# Patient Record
Sex: Female | Born: 1995 | Race: White | Hispanic: No | Marital: Single | Smoking: Never smoker
Health system: Southern US, Community
[De-identification: ages and names within clinical notes are randomized; demographics above are authoritative.]

## PROBLEM LIST (undated history)

## (undated) DIAGNOSIS — F32A Depression, unspecified: Secondary | ICD-10-CM

## (undated) DIAGNOSIS — F419 Anxiety disorder, unspecified: Secondary | ICD-10-CM

## (undated) DIAGNOSIS — F909 Attention-deficit hyperactivity disorder, unspecified type: Secondary | ICD-10-CM

## (undated) HISTORY — DX: Depression, unspecified: F32.A

## (undated) HISTORY — DX: Attention-deficit hyperactivity disorder, unspecified type: F90.9

## (undated) HISTORY — DX: Anxiety disorder, unspecified: F41.9

---
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
## 2014-09-07 ENCOUNTER — Ambulatory Visit: Payer: Self-pay | Admitting: Otolaryngology

## 2015-01-15 LAB — SURGICAL PATHOLOGY

## 2016-06-17 ENCOUNTER — Ambulatory Visit
Admission: EM | Admit: 2016-06-17 | Discharge: 2016-06-17 | Disposition: A | Discharge status: Home / Self Care | Payer: 59 | Attending: Family Medicine | Admitting: Family Medicine

## 2016-06-17 DIAGNOSIS — L03116 Cellulitis of left lower limb: Secondary | ICD-10-CM | POA: Diagnosis not present

## 2016-06-17 MED ORDER — SULFAMETHOXAZOLE-TRIMETHOPRIM 800-160 MG PO TABS: 1.0000 | ORAL_TABLET | Freq: Two times a day (BID) | ORAL | 0 refills | Status: DC

## 2016-06-17 NOTE — ED Triage Notes (Signed)
Patient c/o of cyst on inner left thigh that may need to be lanced. It is painful to the touch.

## 2016-06-17 NOTE — ED Provider Notes (Signed)
[facility]    CSN: [id number] Arrival date & time: 06/17/16  1809     History   Chief Complaint Chief Complaint  Patient presents with  . Cyst    HPI Deanna Graham is a 20 y.o. female.   20 yo female with a 2 days h/o left inner thigh skin bump that is red and painful. Denies any fevers, chills, drainage.    The history is provided by the patient.    History reviewed. No pertinent past medical history.  There are no active problems to display for this patient.   History reviewed. No pertinent surgical history.  OB History    No data available       Home Medications    Prior to Admission medications   Medication Sig Start Date End Date Taking? Authorizing Provider  escitalopram (LEXAPRO) 10 MG tablet Take 10 mg by mouth daily.   Yes Historical Provider, MD  sulfamethoxazole-trimethoprim (BACTRIM DS,SEPTRA DS) 800-160 MG tablet Take 1 tablet by mouth 2 (two) times daily. 06/17/16   Payton Mccallum, MD    Family History History reviewed. No pertinent family history.  Social History Social History  Substance Use Topics  . Smoking status: Never Smoker  . Smokeless tobacco: Never Used  . Alcohol use No     Allergies   Amoxicillin   Review of Systems Review of Systems   Physical Exam Triage Vital Signs ED Triage Vitals  Enc Vitals Group     BP 06/17/16 1931 117/79     Pulse Rate 06/17/16 1931 72     Resp 06/17/16 1931 18     Temp 06/17/16 1931 98.2 F (36.8 C)     Temp Source 06/17/16 1931 Oral     SpO2 06/17/16 1931 99 %     Weight 06/17/16 1928 240 lb (108.9 kg)     Height 06/17/16 1928 5\' 5"  (1.651 m)     Head Circumference --      Peak Flow --      Pain Score 06/17/16 1931 3     Pain Loc --      Pain Edu? --      Excl. in GC? --    No data found.   Updated Vital Signs BP 117/79 (BP Location: Left Arm)   Pulse 72   Temp 98.2 F (36.8 C) (Oral)   Resp 18   Ht 5\' 5"  (1.651 m)   Wt 240 lb (108.9 kg)   LMP 03/22/2016    SpO2 99%   BMI 39.94 kg/m   Visual Acuity Right Eye Distance:   Left Eye Distance:   Bilateral Distance:    Right Eye Near:   Left Eye Near:    Bilateral Near:     Physical Exam  Constitutional: She appears well-developed and well-nourished. No distress.  Skin: She is not diaphoretic. There is erythema.  Left inner thigh, approximately 1cm slight raised soft, purple-violacious, slightly tender skin lesion with mild surrounding blanchable erythema  Nursing note and vitals reviewed.    UC Treatments / Results  Labs (all labs ordered are listed, but only abnormal results are displayed) Labs Reviewed - No data to display  EKG  EKG Interpretation None       Radiology No results found.  Procedures Procedures (including critical care time)  Medications Ordered in UC Medications - No data to display   Initial Impression / Assessment and Plan / UC Course  I have reviewed the triage vital signs and the  nursing notes.  Pertinent labs & imaging results that were available during my care of the patient were reviewed by me and considered in my medical decision making (see chart for details).  Clinical Course      Final Clinical Impressions(s) / UC Diagnoses   Final diagnoses:  Cellulitis of left thigh    New Prescriptions Discharge Medication List as of 06/17/2016  8:07 PM    START taking these medications   Details  sulfamethoxazole-trimethoprim (BACTRIM DS,SEPTRA DS) 800-160 MG tablet Take 1 tablet by mouth 2 (two) times daily., Starting Tue 06/17/2016, Normal       1. diagnosis reviewed with patient 2. rx as per orders above; reviewed possible side effects, interactions, risks and benefits  3. Recommend supportive treatment with warm compresses to area 4. Follow-up prn if symptoms worsen or don't improve   Payton Mccallumrlando Osmin Welz, MD 06/17/16 2102

## 2016-06-20 ENCOUNTER — Telehealth: Payer: Self-pay

## 2016-06-20 NOTE — Telephone Encounter (Signed)
Courtesy call back completed today for patient's recent visit at [facility]. Patient did not answer, left message on machine to call back with any questions or concerns.   

## 2017-02-19 ENCOUNTER — Ambulatory Visit
Admission: EM | Admit: 2017-02-19 | Discharge: 2017-02-19 | Disposition: A | Discharge status: Home / Self Care | Payer: BLUE CROSS/BLUE SHIELD | Attending: Family Medicine | Admitting: Family Medicine

## 2017-02-19 DIAGNOSIS — J020 Streptococcal pharyngitis: Secondary | ICD-10-CM

## 2017-02-19 LAB — RAPID STREP SCREEN (MED CTR MEBANE ONLY): STREPTOCOCCUS, GROUP A SCREEN (DIRECT): POSITIVE — AB

## 2017-02-19 MED ORDER — AZITHROMYCIN 250 MG PO TABS: ORAL_TABLET | ORAL | 0 refills | Status: DC

## 2017-02-19 NOTE — ED Triage Notes (Signed)
Pt c/o sore throat since last night and a close friend tested positive for Strep this morning.

## 2017-02-19 NOTE — ED Provider Notes (Signed)
CSN: [id number]     Arrival date & time 02/19/17  1901 History   First MD Initiated Contact with Patient 02/19/17 1934     Chief Complaint  Patient presents with  . Sore Throat   (Consider location/radiation/quality/duration/timing/severity/associated sxs/prior Treatment) HPI  This a 21 year old female who started having a sore throat last night. She was notified by a friend who is very close to her that she tested positive for strep this morning. Patient had numerous stress strep infections in the past but not since she had her tonsils removed. She states she's had a low-grade fever in the low 99's.       History reviewed. No pertinent past medical history. History reviewed. No pertinent surgical history. History reviewed. No pertinent family history. Social History  Substance Use Topics  . Smoking status: Never Smoker  . Smokeless tobacco: Never Used  . Alcohol use No   OB History    No data available     Review of Systems  Constitutional: Positive for activity change, chills and fever.  HENT: Positive for congestion and sore throat.   All other systems reviewed and are negative.   Allergies  Amoxicillin  Home Medications   Prior to Admission medications   Medication Sig Start Date End Date Taking? Authorizing Provider  escitalopram (LEXAPRO) 10 MG tablet Take 10 mg by mouth daily.   Yes [provider]  azithromycin (ZITHROMAX Z-PAK) 250 MG tablet Use as per package instructions 02/19/17   Lutricia Feiloemer, Kemp Gomes P, PA-C   Meds Ordered and Administered this Visit  Medications - No data to display  BP 135/79 (BP Location: Left Arm)   Pulse 82   Temp 98.8 F (37.1 C) (Oral)   Resp 18   Ht 5\' 5"  (1.651 m)   Wt 270 lb (122.5 kg)   SpO2 99%   BMI 44.93 kg/m  No data found.   Physical Exam  Constitutional: She is oriented to person, place, and time. She appears well-developed and well-nourished. No distress.  HENT:  Head: Normocephalic.  Right Ear:  External ear normal.  Left Ear: External ear normal.  Nose: Nose normal.  Mouth/Throat: Oropharynx is clear and moist. No oropharyngeal exudate.  Eyes: Pupils are equal, round, and reactive to light. Right eye exhibits no discharge. Left eye exhibits no discharge.  Neck: Normal range of motion.  Pulmonary/Chest: Effort normal and breath sounds normal.  Musculoskeletal: Normal range of motion.  Lymphadenopathy:    She has no cervical adenopathy.  Neurological: She is alert and oriented to person, place, and time.  Skin: Skin is warm and dry. She is not diaphoretic.  Psychiatric: She has a normal mood and affect. Her behavior is normal. Judgment and thought content normal.  Nursing note and vitals reviewed.   Urgent Care Course     Procedures (including critical care time)  Labs Review Labs Reviewed  RAPID STREP SCREEN (NOT AT [facility]) - Abnormal; Notable for the following:       Result Value   Streptococcus, Group A Screen (Direct) POSITIVE (*)    All other components within normal limits    Imaging Review No results found.   Visual Acuity Review  Right Eye Distance:   Left Eye Distance:   Bilateral Distance:    Right Eye Near:   Left Eye Near:    Bilateral Near:         MDM   1. Strep throat    Discharge Medication List as of 02/19/2017  7:47 PM  START taking these medications   Details  azithromycin (ZITHROMAX Z-PAK) 250 MG tablet Use as per package instructions, Normal      Plan: 1. Test/x-ray results and diagnosis reviewed with patient 2. rx as per orders; risks, benefits, potential side effects reviewed with patient 3. Recommend supportive treatment with Salt water gargles for comfort. Use Motrin for inflammation and fever control. Follow up with primary care physician if she is not improving. 4. F/u prn if symptoms worsen or don't improve     Lutricia Feil, PA-C 02/19/17 2001

## 2019-05-11 ENCOUNTER — Encounter: Payer: Self-pay | Admitting: Emergency Medicine

## 2019-05-11 ENCOUNTER — Ambulatory Visit
Admission: EM | Admit: 2019-05-11 | Discharge: 2019-05-11 | Disposition: A | Discharge status: Home / Self Care | Payer: BLUE CROSS/BLUE SHIELD | Attending: Family Medicine | Admitting: Family Medicine

## 2019-05-11 ENCOUNTER — Other Ambulatory Visit: Payer: Self-pay

## 2019-05-11 ENCOUNTER — Encounter: Payer: Self-pay | Admitting: Obstetrics and Gynecology

## 2019-05-11 DIAGNOSIS — N898 Other specified noninflammatory disorders of vagina: Secondary | ICD-10-CM

## 2019-05-11 LAB — WET PREP, GENITAL
Clue Cells Wet Prep HPF POC: NONE SEEN
Sperm: NONE SEEN
Trich, Wet Prep: NONE SEEN
Yeast Wet Prep HPF POC: NONE SEEN

## 2019-05-11 LAB — URINALYSIS, COMPLETE (UACMP) WITH MICROSCOPIC
Glucose, UA: NEGATIVE mg/dL
Leukocytes,Ua: NEGATIVE
Nitrite: NEGATIVE
Specific Gravity, Urine: >1.03 — ABNORMAL HIGH (ref 1.005–1.030)
pH: 6 (ref 5.0–8.0)

## 2019-05-11 LAB — PREGNANCY, URINE: Preg Test, Ur: NEGATIVE

## 2019-05-11 NOTE — Discharge Instructions (Signed)
We will call with positive results. ° °Take care ° °Dr. Jia Mohamed  °

## 2019-05-11 NOTE — ED Triage Notes (Addendum)
Patient here for STD testing for Chlamydia. Patient c/o vaginal discharge that started 1 week ago. She reports she changed birth control pills and is unsure if this is related.  She is also requesting a pregnancy test

## 2019-05-11 NOTE — ED Provider Notes (Signed)
[facility]    CSN: [id number] Arrival date & time: 05/11/19  0849  History   Chief Complaint Chief Complaint  Patient presents with  . Exposure to STD   HPI  23 year old female presents with vaginal discharge.  Patient reports that she has had dark, bloody discharge for the past 2 weeks.  She has recently changed her oral contraceptive.  She believes that this may be the culprit.  Patient is sexually active.  She desires STD testing today.  She states that her partner has complained of "irritation" which has prompted her to want STD testing.  She states that she is monogamous.  Denies urinary symptoms.  Patient requesting a pregnancy test as well.  No abdominal pain.  No medications or interventions tried.  No other complaints.  Hx reviewed as below. PMH: Morbid Obesity, Depression/Anxiety, Headache, Seizure  Surgical Hx:  MYRINGOTOMY & TUBES 09/23/1995 - 09/21/1996     CLOSED REDUCTION RADIAL HEAD / NECK FRACTURE 09/22/1998 - 09/22/1999  Under general anesthesia   TONSILLECTOMY 09/22/2013 - 09/21/2014  & adenioidectomy      Home Medications    Prior to Admission medications   Medication Sig Start Date End Date Taking? Authorizing Provider  escitalopram (LEXAPRO) 10 MG tablet Take 10 mg by mouth daily.   Yes [provider]  norgestimate-ethinyl estradiol (MONO-LINYAH) 0.25-35 MG-MCG tablet Take 1 tablet by mouth daily.   Yes [provider]   Social History Social History   Tobacco Use  . Smoking status: Never Smoker  . Smokeless tobacco: Never Used  Substance Use Topics  . Alcohol use: Yes  . Drug use: No     Allergies   Amoxicillin   Review of Systems Review of Systems  Constitutional: Negative.   Gastrointestinal: Negative.   Genitourinary: Positive for vaginal discharge.   Physical Exam Triage Vital Signs ED Triage Vitals  Enc Vitals Group     BP 05/11/19 0908 124/68     Pulse Rate 05/11/19 0908 90     Resp 05/11/19  0908 18     Temp 05/11/19 0908 98.6 F (37 C)     Temp src --      SpO2 --      Weight 05/11/19 0905 262 lb (118.8 kg)     Height 05/11/19 0905 5\' 5"  (1.651 m)     Head Circumference --      Peak Flow --      Pain Score 05/11/19 0905 0     Pain Loc --      Pain Edu? --      Excl. in GC? --    Updated Vital Signs BP 124/68 (BP Location: Right Arm)   Pulse 90   Temp 98.6 F (37 C)   Resp 18   Ht 5\' 5"  (1.651 m)   Wt 118.8 kg   BMI 43.60 kg/m   Visual Acuity Right Eye Distance:   Left Eye Distance:   Bilateral Distance:    Right Eye Near:   Left Eye Near:    Bilateral Near:     Physical Exam Vitals signs and nursing note reviewed.  Constitutional:      General: She is not in acute distress.    Appearance: Normal appearance. She is obese.  HENT:     Head: Normocephalic and atraumatic.  Eyes:     General:        Right eye: No discharge.        Left eye: No discharge.  Conjunctiva/sclera: Conjunctivae normal.  Cardiovascular:     Rate and Rhythm: Normal rate and regular rhythm.     Heart sounds: No murmur.  Pulmonary:     Effort: Pulmonary effort is normal.     Breath sounds: Normal breath sounds. No wheezing, rhonchi or rales.  Abdominal:     General: There is no distension.     Palpations: Abdomen is soft.     Tenderness: There is no abdominal tenderness.  Neurological:     Mental Status: She is alert.  Psychiatric:        Mood and Affect: Mood normal.        Behavior: Behavior normal.    UC Treatments / Results  Labs (all labs ordered are listed, but only abnormal results are displayed) Labs Reviewed  WET PREP, GENITAL - Abnormal; Notable for the following components:      Result Value   WBC, Wet Prep HPF POC FEW (*)    All other components within normal limits  URINALYSIS, COMPLETE (UACMP) WITH MICROSCOPIC - Abnormal; Notable for the following components:   Specific Gravity, Urine >1.030 (*)    Hgb urine dipstick SMALL (*)    Bilirubin Urine  SMALL (*)    Ketones, ur TRACE (*)    Protein, ur TRACE (*)    Bacteria, UA FEW (*)    All other components within normal limits  GC/CHLAMYDIA PROBE AMP  PREGNANCY, URINE    EKG   Radiology No results found.  Procedures Procedures (including critical care time)  Medications Ordered in UC Medications - No data to display  Initial Impression / Assessment and Plan / UC Course  I have reviewed the triage vital signs and the nursing notes.  Pertinent labs & imaging results that were available during my care of the patient were reviewed by me and considered in my medical decision making (see chart for details).    23 year old female presents with vaginal discharge.  Urinalysis unremarkable.  Wet prep negative.  Pregnancy test negative.  Awaiting STD testing.  I feel that this is secondary to recent change in oral contraceptive.  Awaiting test results.  Supportive care.  Final Clinical Impressions(s) / UC Diagnoses   Final diagnoses:  Vaginal discharge     Discharge Instructions     We will call with positive results.  Take care  Dr. Lacinda Axon   ED Prescriptions    None     Controlled Substance Prescriptions [place] Controlled Substance Registry consulted? Not Applicable   Coral Spikes, DO 05/11/19 1016

## 2019-05-14 LAB — GC/CHLAMYDIA PROBE AMP
Chlamydia trachomatis, NAA: POSITIVE — AB
Neisseria Gonorrhoeae by PCR: NEGATIVE

## 2019-05-16 ENCOUNTER — Telehealth (HOSPITAL_COMMUNITY): Payer: Self-pay | Admitting: Emergency Medicine

## 2019-05-16 MED ORDER — AZITHROMYCIN 250 MG PO TABS: 1000.0000 mg | ORAL_TABLET | Freq: Once | ORAL | 0 refills | Status: AC

## 2019-05-16 NOTE — Telephone Encounter (Signed)
Chlamydia is positive.  Rx po zithromax 1g #1 dose no refills was sent to the pharmacy of record.  Pt needs education to please refrain from sexual intercourse for 7 days to give the medicine time to work, sexual partners need to be notified and tested/treated.  Condoms may reduce risk of reinfection.  Recheck or followup with PCP for further evaluation if symptoms are not improving.   [facility] notified.  Patient contacted and made aware of    results, all questions answered   

## 2021-04-17 NOTE — Progress Notes (Deleted)
    Gauger, Hermenia Fiscal, NP   No chief complaint on file.   HPI:      Ms. Deanna Graham is a 25 y.o. No obstetric history on file. whose LMP was No LMP recorded. (Menstrual status: Irregular Periods)., presents today for NP  ??PAP  No past medical history on file.  No past surgical history on file.  No family history on file.  Social History   Socioeconomic History   Marital status: Single    Spouse name: Not on file   Number of children: Not on file   Years of education: Not on file   Highest education level: Not on file  Occupational History   Not on file  Tobacco Use   Smoking status: Never   Smokeless tobacco: Never  Vaping Use   Vaping Use: Every day  Substance and Sexual Activity   Alcohol use: Yes   Drug use: No   Sexual activity: Not on file  Other Topics Concern   Not on file  Social History Narrative   Not on file   Social Determinants of Health   Financial Resource Strain: Not on file  Food Insecurity: Not on file  Transportation Needs: Not on file  Physical Activity: Not on file  Stress: Not on file  Social Connections: Not on file  Intimate Partner Violence: Not on file    Outpatient Medications Prior to Visit  Medication Sig Dispense Refill   escitalopram (LEXAPRO) 10 MG tablet Take 10 mg by mouth daily.     norgestimate-ethinyl estradiol (MONO-LINYAH) 0.25-35 MG-MCG tablet Take 1 tablet by mouth daily.     No facility-administered medications prior to visit.      ROS:  Review of Systems BREAST: No symptoms   OBJECTIVE:   Vitals:  There were no vitals taken for this visit.  Physical Exam  Results: No results found for this or any previous visit (from the past 24 hour(s)).   Assessment/Plan: No diagnosis found.    No orders of the defined types were placed in this encounter.     No follow-ups on file.  Milbern Doescher B. Rebekka Lobello, PA-C 04/17/2021 2:55 PM

## 2021-04-18 ENCOUNTER — Encounter: Payer: BC Managed Care – PPO | Admitting: Obstetrics and Gynecology

## 2021-04-18 DIAGNOSIS — Z124 Encounter for screening for malignant neoplasm of cervix: Secondary | ICD-10-CM

## 2021-04-18 DIAGNOSIS — Z113 Encounter for screening for infections with a predominantly sexual mode of transmission: Secondary | ICD-10-CM

## 2022-01-09 ENCOUNTER — Ambulatory Visit: Payer: Managed Care, Other (non HMO) | Admitting: Nurse Practitioner

## 2022-01-09 ENCOUNTER — Other Ambulatory Visit: Payer: Self-pay

## 2022-01-09 ENCOUNTER — Encounter: Payer: Self-pay | Admitting: Nurse Practitioner

## 2022-01-09 VITALS — BP 124/72 | HR 98 | Temp 98.6°F | Resp 18 | Ht 66.0 in | Wt 312.5 lb

## 2022-01-09 DIAGNOSIS — K591 Functional diarrhea: Secondary | ICD-10-CM

## 2022-01-09 DIAGNOSIS — Z131 Encounter for screening for diabetes mellitus: Secondary | ICD-10-CM

## 2022-01-09 DIAGNOSIS — R6889 Other general symptoms and signs: Secondary | ICD-10-CM

## 2022-01-09 DIAGNOSIS — M7989 Other specified soft tissue disorders: Secondary | ICD-10-CM

## 2022-01-09 DIAGNOSIS — R635 Abnormal weight gain: Secondary | ICD-10-CM

## 2022-01-09 DIAGNOSIS — R509 Fever, unspecified: Secondary | ICD-10-CM

## 2022-01-09 DIAGNOSIS — Z13220 Encounter for screening for lipoid disorders: Secondary | ICD-10-CM

## 2022-01-09 DIAGNOSIS — N915 Oligomenorrhea, unspecified: Secondary | ICD-10-CM | POA: Diagnosis not present

## 2022-01-09 DIAGNOSIS — F418 Other specified anxiety disorders: Secondary | ICD-10-CM | POA: Insufficient documentation

## 2022-01-09 DIAGNOSIS — Z1159 Encounter for screening for other viral diseases: Secondary | ICD-10-CM

## 2022-01-09 DIAGNOSIS — R238 Other skin changes: Secondary | ICD-10-CM

## 2022-01-09 DIAGNOSIS — Z114 Encounter for screening for human immunodeficiency virus [HIV]: Secondary | ICD-10-CM

## 2022-01-09 DIAGNOSIS — R61 Generalized hyperhidrosis: Secondary | ICD-10-CM

## 2022-01-09 NOTE — Progress Notes (Signed)
? ?BP 124/72   Pulse 98   Temp 98.6 ?F (37 ?C) (Oral)   Resp 18   Ht 5\' 6"  (1.676 m)   Wt (!) 312 lb 8 oz (141.7 kg)   SpO2 97%   BMI 50.44 kg/m?   ? ?Subjective:  ? ? Patient ID: Deanna Graham, female    DOB: [birth year], 26 y.o.   MRN: [id number] ? ?HPI: ?Deanna Graham is a 26 y.o. female ? ?Chief Complaint  ?Patient presents with  ? Establish Care  ? Joint Pain  ?  inflammation  ? ?Establish care:  last physical was about two years ago, she reports she had elevated blood pressure but was not diagnosed with HTN and has not had any issues.  ? ?Fever/sweating/hand and feet swelling/ heat intolerance/ weight gain/skin sensitivity: She says that these symptoms have been going on for many years. She says maybe 5-6 years. She says that she does get a rash across her face. She does not currently have one. She reports low grade fevers, feels flushed off and on.  She says she feels like she has had a lot of inflammation.  She says that she has intolerance to heat and is sweating a lot more than normal. She says her skin feels sensitive to touch.  She says she feels like her feet and hands have been swelling. She says she has had a lot of cramping, stiffness and tightness in hands and feet. She says that after she eats she gets gassy, diarrhea and abdominal cramping. She says that she has gained about 100 lbs over the last 8 years.  ? ?Irregular periods: [facility]: was in February.  She says her first period was 67-47 years old but has never been regular.  She says that she has been on birth control before and it did not regulate her periods. Will place referral to GYN.  ? ?Obesity: She is currently 312 pounds with a BMI of 50.44. she says that over the last 8 years she has gained 100 pounds.  She says that she eats pretty healthy and is physically active, continues to gain weight.  We will get labs. ? ?Eczema: She says that she typically gets eczema in her flexeril areas like the bend of her arms and legs. She is using  eucerine cream to help  ? ?Relevant past medical, surgical, family and social history reviewed and updated as indicated. Interim medical history since our last visit reviewed. ?Allergies and medications reviewed and updated. ? ?Review of Systems ? ?Constitutional : positive for fever and weight change.  ?Respiratory: Negative for cough and shortness of breath.   ?Cardiovascular: Negative for chest pain or palpitations.  ?Gastrointestinal: positive for abdominal pain, diarrhea ?Musculoskeletal: Negative for gait problem, positive for hand and feet joint swelling.  ?Skin: Negative for rash.  ?Neurological: Negative for dizziness or headache.  ?No other specific complaints in a complete review of systems (except as listed in HPI above).  ? ?   ?Objective:  ?  ?BP 124/72   Pulse 98   Temp 98.6 ?F (37 ?C) (Oral)   Resp 18   Ht 5\' 6"  (1.676 m)   Wt (!) 312 lb 8 oz (141.7 kg)   SpO2 97%   BMI 50.44 kg/m?   ?Wt Readings from Last 3 Encounters:  ?01/09/22 (!) 312 lb 8 oz (141.7 kg)  ?05/11/19 262 lb (118.8 kg)  ?02/19/17 270 lb (122.5 kg)  ?  ?Physical Exam ? ?Constitutional: Patient appears well-developed and well-nourished.  Obese  No distress.  ?HEENT: head atraumatic, normocephalic, pupils equal and reactive to light, neck supple ?Cardiovascular: Normal rate, regular rhythm and normal heart sounds.  No murmur heard. No BLE edema. ?Pulmonary/Chest: Effort normal and breath sounds normal. No respiratory distress. ?Abdominal: Soft.  There is no tenderness. ?Psychiatric: Patient has a normal mood and affect. behavior is normal. Judgment and thought content normal.  ?Results for orders placed or performed during the hospital encounter of 05/11/19  ?Wet prep, genital  ? Specimen: Vaginal  ?Result Value Ref Range  ? Yeast Wet Prep HPF POC NONE SEEN NONE SEEN  ? Trich, Wet Prep NONE SEEN NONE SEEN  ? Clue Cells Wet Prep HPF POC NONE SEEN NONE SEEN  ? WBC, Wet Prep HPF POC FEW (A) NONE SEEN  ? Sperm NONE SEEN    ?GC/Chlamydia Probe Amp (send out to [facility])  ? Specimen: Vaginal; Sterile Swab  ?Result Value Ref Range  ? Chlamydia trachomatis, NAA Positive (A) Negative  ? Neisseria Gonorrhoeae by PCR Negative Negative  ? CT/NG NAA Source ENDOCERVICAL   ?Urinalysis, Complete w Microscopic  ?Result Value Ref Range  ? Color, Urine YELLOW YELLOW  ? APPearance CLEAR CLEAR  ? Specific Gravity, Urine >1.030 (H) 1.005 - 1.030  ? pH 6.0 5.0 - 8.0  ? Glucose, UA NEGATIVE NEGATIVE mg/dL  ? Hgb urine dipstick SMALL (A) NEGATIVE  ? Bilirubin Urine SMALL (A) NEGATIVE  ? Ketones, ur TRACE (A) NEGATIVE mg/dL  ? Protein, ur TRACE (A) NEGATIVE mg/dL  ? Nitrite NEGATIVE NEGATIVE  ? Leukocytes,Ua NEGATIVE NEGATIVE  ? Squamous Epithelial / LPF 0-5 0 - 5  ? WBC, UA 6-10 0 - 5 WBC/hpf  ? RBC / HPF 0-5 0 - 5 RBC/hpf  ? Bacteria, UA FEW (A) NONE SEEN  ? Mucus PRESENT   ?Pregnancy, urine  ?Result Value Ref Range  ? Preg Test, Ur NEGATIVE NEGATIVE  ? ?   ?Assessment & Plan:  ? ?1. Oligomenorrhea, unspecified type ? ?- Ambulatory referral to Gynecology ? ?2. Obesity, morbid, BMI 40.0-49.9 ([place]) ? ?- CBC with Differential/Platelet ?- COMPLETE METABOLIC PANEL WITH GFR ?- Hemoglobin A1c ? ?3. Screening for diabetes mellitus ? ?- COMPLETE METABOLIC PANEL WITH GFR ?- Hemoglobin A1c ? ?4. Heat intolerance ? ?- CBC with Differential/Platelet ?- COMPLETE METABOLIC PANEL WITH GFR ?- Sedimentation rate ?- C-reactive protein ?- ANA ?- TSH ? ?5. Weight gain ? ?- CBC with Differential/Platelet ?- COMPLETE METABOLIC PANEL WITH GFR ?- Sedimentation rate ?- C-reactive protein ?- ANA ?- TSH ? ?6. Functional diarrhea ? ?- CBC with Differential/Platelet ?- COMPLETE METABOLIC PANEL WITH GFR ?- Sedimentation rate ?- C-reactive protein ?- ANA ?- TSH ?- Calprotectin, Fecal ? ?7. Skin sensitivity ? ?- CBC with Differential/Platelet ?- COMPLETE METABOLIC PANEL WITH GFR ?- Sedimentation rate ?- C-reactive protein ?- ANA ?- TSH ? ?8. Excessive sweating ?- CBC with  Differential/Platelet ?- COMPLETE METABOLIC PANEL WITH GFR ?- Sedimentation rate ?- C-reactive protein ?- ANA ?- TSH ? ?9. Swelling of both hands ? ?- CBC with Differential/Platelet ?- COMPLETE METABOLIC PANEL WITH GFR ?- Sedimentation rate ?- C-reactive protein ?- ANA ?- TSH ? ?10. Bilateral swelling of feet ? ?- CBC with Differential/Platelet ?- COMPLETE METABOLIC PANEL WITH GFR ?- Sedimentation rate ?- C-reactive protein ?- ANA ?- TSH ? ?11. Low grade fever ? ?- CBC with Differential/Platelet ?- COMPLETE METABOLIC PANEL WITH GFR ?- Sedimentation rate ?- C-reactive protein ?- ANA ?- TSH ? ?12. Encounter for hepatitis C screening test  for low risk patient ? ?- Hepatitis C antibody ? ?13. Encounter for screening for HIV ? ?- HIV Antibody (routine testing w rflx) ? ?14. Screening for cholesterol level ? ?- Lipid panel  ? ?Follow up plan: ?Return in about 6 months (around 07/11/2022) for cpe. ? ? ? ? ? ?

## 2022-01-11 LAB — COMPLETE METABOLIC PANEL WITH GFR
AG Ratio: 1.4 (calc) (ref 1.0–2.5)
ALT: 27 U/L (ref 6–29)
AST: 14 U/L (ref 10–30)
Albumin: 3.9 g/dL (ref 3.6–5.1)
Alkaline phosphatase (APISO): 107 U/L (ref 31–125)
BUN: 11 mg/dL (ref 7–25)
CO2: 29 mmol/L (ref 20–32)
Calcium: 9.3 mg/dL (ref 8.6–10.2)
Chloride: 104 mmol/L (ref 98–110)
Creat: 0.8 mg/dL (ref 0.50–0.96)
Globulin: 2.7 g/dL (calc) (ref 1.9–3.7)
Glucose, Bld: 110 mg/dL — ABNORMAL HIGH (ref 65–99)
Potassium: 4 mmol/L (ref 3.5–5.3)
Sodium: 142 mmol/L (ref 135–146)
Total Bilirubin: 0.3 mg/dL (ref 0.2–1.2)
Total Protein: 6.6 g/dL (ref 6.1–8.1)
eGFR: 104 mL/min/{1.73_m2} (ref >=60)

## 2022-01-11 LAB — CBC WITH DIFFERENTIAL/PLATELET
Absolute Monocytes: 671 cells/uL (ref 200–950)
Basophils Absolute: 31 cells/uL (ref 0–200)
Basophils Relative: 0.4 %
Eosinophils Absolute: 296 cells/uL (ref 15–500)
Eosinophils Relative: 3.8 %
HCT: 39.6 % (ref 35.0–45.0)
Hemoglobin: 13 g/dL (ref 11.7–15.5)
Lymphs Abs: 2075 cells/uL (ref 850–3900)
MCH: 27.2 pg (ref 27.0–33.0)
MCHC: 32.8 g/dL (ref 32.0–36.0)
MCV: 82.8 fL (ref 80.0–100.0)
MPV: 10.8 fL (ref 7.5–12.5)
Monocytes Relative: 8.6 %
Neutro Abs: 4727 cells/uL (ref 1500–7800)
Neutrophils Relative %: 60.6 %
Platelets: 405 10*3/uL — ABNORMAL HIGH (ref 140–400)
RBC: 4.78 10*6/uL (ref 3.80–5.10)
RDW: 12.8 % (ref 11.0–15.0)
Total Lymphocyte: 26.6 %
WBC: 7.8 10*3/uL (ref 3.8–10.8)

## 2022-01-11 LAB — LIPID PANEL
Cholesterol: 179 mg/dL (ref <200)
HDL: 37 mg/dL — ABNORMAL LOW (ref >=50)
LDL Cholesterol (Calc): 119 mg/dL (calc) — ABNORMAL HIGH
Non-HDL Cholesterol (Calc): 142 mg/dL (calc) — ABNORMAL HIGH (ref <130)
Total CHOL/HDL Ratio: 4.8 (calc) (ref <5.0)
Triglycerides: 120 mg/dL (ref <150)

## 2022-01-11 LAB — SEDIMENTATION RATE: Sed Rate: 29 mm/h — ABNORMAL HIGH (ref 0–20)

## 2022-01-11 LAB — ANA: Anti Nuclear Antibody (ANA): POSITIVE — AB

## 2022-01-11 LAB — HEPATITIS C ANTIBODY
Hepatitis C Ab: NONREACTIVE
SIGNAL TO CUT-OFF: 0.18 (ref <1.00)

## 2022-01-11 LAB — HEMOGLOBIN A1C
Hgb A1c MFr Bld: 5.6 % of total Hgb (ref <5.7)
Mean Plasma Glucose: 114 mg/dL
eAG (mmol/L): 6.3 mmol/L

## 2022-01-11 LAB — ANTI-NUCLEAR AB-TITER (ANA TITER): ANA Titer 1: 1:80 {titer} — ABNORMAL HIGH

## 2022-01-11 LAB — C-REACTIVE PROTEIN: CRP: 28.8 mg/L — ABNORMAL HIGH (ref <8.0)

## 2022-01-11 LAB — HIV ANTIBODY (ROUTINE TESTING W REFLEX): HIV 1&2 Ab, 4th Generation: NONREACTIVE

## 2022-01-11 LAB — TSH: TSH: 1.25 mIU/L

## 2022-01-13 ENCOUNTER — Other Ambulatory Visit: Payer: Self-pay | Admitting: Nurse Practitioner

## 2022-01-13 ENCOUNTER — Telehealth: Payer: Self-pay

## 2022-01-13 DIAGNOSIS — R7982 Elevated C-reactive protein (CRP): Secondary | ICD-10-CM

## 2022-01-13 DIAGNOSIS — R768 Other specified abnormal immunological findings in serum: Secondary | ICD-10-CM

## 2022-01-13 DIAGNOSIS — R700 Elevated erythrocyte sedimentation rate: Secondary | ICD-10-CM

## 2022-01-13 NOTE — Telephone Encounter (Signed)
-----   Message from Darrel Hoover, RN sent at 01/09/2022  4:23 PM EDT ----- Regarding: referral Schedule with any APP for irregular bleeding on birth control with no improvement in 4-5 weeks unless you have something sooner.

## 2022-01-13 NOTE — Telephone Encounter (Signed)
[facility] referring for irregular bleeding on birth control with no improvement. Sch w CNM.in 4-5 weeks unless you have something sooner. Called and left voicemail for patient to call back to be scheduled.

## 2022-01-14 NOTE — Telephone Encounter (Signed)
Patient is scheduled for 01/31/22 with LMD

## 2022-01-31 ENCOUNTER — Encounter: Payer: BC Managed Care – PPO | Admitting: Licensed Practical Nurse

## 2022-02-07 ENCOUNTER — Encounter: Payer: BC Managed Care – PPO | Admitting: Licensed Practical Nurse

## 2022-02-14 ENCOUNTER — Encounter: Payer: BC Managed Care – PPO | Admitting: Obstetrics

## 2022-02-19 ENCOUNTER — Encounter: Payer: Self-pay | Admitting: Obstetrics

## 2022-02-19 ENCOUNTER — Ambulatory Visit: Payer: Managed Care, Other (non HMO) | Admitting: Obstetrics

## 2022-02-19 VITALS — BP 130/92 | Wt 320.6 lb

## 2022-02-19 DIAGNOSIS — Z6841 Body Mass Index (BMI) 40.0 and over, adult: Secondary | ICD-10-CM

## 2022-02-19 DIAGNOSIS — R030 Elevated blood-pressure reading, without diagnosis of hypertension: Secondary | ICD-10-CM | POA: Diagnosis not present

## 2022-02-19 DIAGNOSIS — N939 Abnormal uterine and vaginal bleeding, unspecified: Secondary | ICD-10-CM

## 2022-02-19 DIAGNOSIS — R768 Other specified abnormal immunological findings in serum: Secondary | ICD-10-CM

## 2022-02-19 LAB — POCT URINE PREGNANCY: Preg Test, Ur: NEGATIVE

## 2022-02-19 NOTE — Progress Notes (Addendum)
Chief Complaint  Patient presents with   Contraception    Patient comes in office today with concerns of irregular bleeding and would like to discuss contraception.    Patient Deanna Graham is an [id number]. Patient's last menstrual period was 02/09/2022 (exact date). currently nothing for contraception who presents for the complaint of abnormal uterine bleeding. She was on junel for about a year and when stopped in May 2021 bled for 3 months. Following mo menses for 6 months. Has had irregular bleeding and last normal menses was 10/2021. Last week had a large amount of bleeding bright red blood which lasted 7 days. She normally does not have heavy flow or pain and this was both. S/p recent labs with abnormal ANA and she has follow up scheduled with rheum. Dad does have an autoimmune disorder unknown what. She is interested in contraception but it has made her gain weight and increase her breast size, most notable when she stops the pill. States her BP is high when she goes to the doctor. Pronouns are she her.  Recent glucocorticoid use: no  She denies vision changes, bleeding gums, or nose bleeds. She denies easy bleeding or bruising. There is no family history of blood disorders. She denies acne, hirsutism,but reports scalp hair loss. She denies voice or clitoral changes. She is not currently sexually active since January and when she does have intercourse it can be painful, not all the time. Pain is with entry and deep penetration. Sex with men and women.  She is no exercising regularly. She does not perform self breast exam. PGM with breast cancer around age 89. She takes a multivitamin. She reports vasomotor symptoms, or vaginal dryness.  Ultrasound: none  Follows with primary care provider: Berniece Salines, FNP  Review of Systems  Constitutional:  Negative for activity change, appetite change, chills, diaphoresis, fatigue, fever and unexpected weight change.  HENT:  Negative for congestion,  dental problem, drooling, ear discharge, ear pain, facial swelling, hearing loss, mouth sores, nosebleeds, postnasal drip, rhinorrhea, sinus pressure, sinus pain, sneezing, sore throat, tinnitus, trouble swallowing and voice change.   Eyes:  Negative for photophobia, pain, discharge, redness, itching and visual disturbance.  Respiratory:  Negative for apnea, cough, choking, chest tightness, shortness of breath, wheezing and stridor.   Cardiovascular:  Negative for chest pain, palpitations and leg swelling.  Gastrointestinal:  Positive for constipation and diarrhea. Negative for abdominal distention, abdominal pain, anal bleeding, blood in stool, nausea, rectal pain and vomiting.  Endocrine: Positive for heat intolerance. Negative for cold intolerance, polydipsia, polyphagia and polyuria.  Genitourinary:  Positive for dyspareunia, menstrual problem and vaginal bleeding. Negative for decreased urine volume, difficulty urinating, dysuria, enuresis, flank pain, frequency, genital sores, hematuria, pelvic pain, urgency, vaginal discharge and vaginal pain.  Musculoskeletal:  Positive for arthralgias and myalgias. Negative for back pain, gait problem, joint swelling, neck pain and neck stiffness.  Skin:  Negative for color change, pallor, rash and wound.  Allergic/Immunologic: Negative for environmental allergies, food allergies and immunocompromised state.  Neurological:  Positive for headaches. Negative for dizziness, tremors, seizures, syncope, facial asymmetry, speech difficulty, weakness, light-headedness and numbness.  Hematological:  Negative for adenopathy. Does not bruise/bleed easily.  Psychiatric/Behavioral:  Negative for agitation, behavioral problems, confusion, decreased concentration, dysphoric mood, hallucinations, self-injury, sleep disturbance and suicidal ideas. The patient is nervous/anxious. The patient is not hyperactive.    Gynecologic History: Menarche: 12 Period Pattern: (!)  Irregular Menstrual Flow: Light, Moderate Dysmenorrhea: None Cycles have always been irregular.  She denies abnormal paps.  History of chlamydia 2020 She denies domestic violence or sexual abuse. Patient did receive Gardisil  Health Maintenance  Topic Date Due   HPV VACCINES (1 - 2-dose series) Never done   PAP-Cervical Cytology Screening  Never done   COVID-19 Vaccine (3 - Moderna risk series) 07/06/2020   INFLUENZA VACCINE  04/22/2022   PAP SMEAR-Modifier  08/24/2022   TETANUS/TDAP  06/17/2028   Hepatitis C Screening  Completed   HIV Screening  Completed    Past Medical History:  Diagnosis Date   ADHD    Anxiety    Depression    History reviewed. No pertinent surgical history. Family History  Problem Relation Age of Onset   Hypertension Mother    Hypertension Father    Social History   Socioeconomic History   Marital status: Single    Spouse name: Not on file   Number of children: 0   Years of education: Not on file   Highest education level: Not on file  Occupational History   Not on file  Tobacco Use   Smoking status: Never   Smokeless tobacco: Never  Vaping Use   Vaping Use: Every day  Substance and Sexual Activity   Alcohol use: Yes   Drug use: No   Sexual activity: Not Currently    Birth control/protection: None  Other Topics Concern   Not on file  Social History Narrative   Not on file   Social Determinants of Health   Financial Resource Strain: Not on file  Food Insecurity: Not on file  Transportation Needs: Not on file  Physical Activity: Not on file  Stress: Not on file  Social Connections: Not on file  Intimate Partner Violence: Not on file    Medicine list and allergies reviewed and updated.    Objective:  BP (!) 130/92   Wt (!) 320 lb 9.6 oz (145.4 kg)   LMP 02/09/2022 (Exact Date) Comment: patient reports that last menses was Feb 2023  BMI 51.75 kg/m  Physical Exam Vitals and nursing note reviewed.  Constitutional:       Appearance: Normal appearance. She is obese.  HENT:     Head: Normocephalic and atraumatic.  Eyes:     Extraocular Movements: Extraocular movements intact.  Pulmonary:     Effort: Pulmonary effort is normal.  Musculoskeletal:        General: Normal range of motion.     Cervical back: Normal range of motion.  Skin:    General: Skin is warm and dry.  Neurological:     General: No focal deficit present.     Mental Status: She is alert and oriented to person, place, and time.  Psychiatric:        Mood and Affect: Mood normal.        Behavior: Behavior normal.        Thought Content: Thought content normal.    Assessment:  Abnormal uterine bleeding (AUB) - Plan: US PELVIC COMPLETE WITH TRANSVAGINAL, 17-Hydroxyprogesterone, DHEA-sulfate, Testosterone, Estradiol, FSH, LH, Anti mullerian hormone, Insulin, random, Sex hormone binding globulin, Prolactin, POCT urine pregnancy  BMI 50.0-59.9, adult (HCC)  Elevated blood pressure reading  Positive ANA (antinuclear antibody)  Plan: Physical exam findings of metabolic syndrome are/are not identified: Blood pressure, BMI, increased weight circumference. Likely diagnosis of PCOS discussed in detail with patient. Discussed the increased risk of endometrial hyperplasia and cancer in PCOS and thus the necessity of patient cycling at least every 3 months if not  on contraception.  PCOS labs obtained as above Will have patient return for [place]. Discussed option of slynd for contraception as elevated BP today. She does not want anything inserted.  Pt reports normal BP when checks with wrist cuff at home. Given BMI and BP today would be more likely to use estrogen free contraceptive.  Return for pap.  Monthly SBE.  Recommend MVI. Daily folic acid, calcium and vitamin D requirements discussed with patient.  Contraception: S/p Gardisil  Has follow up established for abn rheum labs.   Return if symptoms worsen or fail to improve, for GYN [place] and follow  up aub will need fasting labs.  A total of 54 minutes were spent for the patient inclusive of face-to-face time during visit, preparation, review, communication, and documentation during this encounter.

## 2022-02-19 NOTE — Patient Instructions (Addendum)
Deanna Graham Saxenda  Have a great year! Please call with any concerns. Don't forget to wear your seatbelt everyday! If you are not signed up on MyChart, please ask [place] how to sign up for it!   In a world where you can be anything, please be kind.   There is no height or weight on file to calculate BMI.  A Healthy Lifestyle: Care Instructions Your Care Instructions  A healthy lifestyle can help you feel good, stay at a healthy weight, and have plenty of energy for both work and play. A healthy lifestyle is something you can share with your whole family. A healthy lifestyle also can lower your risk for serious health problems, such as high blood pressure, heart disease, and diabetes. You can follow a few steps listed below to improve your health and the health of your family. Follow-up care is a key part of your treatment and safety. Be sure to make and go to all appointments, and call your doctor if you are having problems. It's also a good idea to know your test results and keep a list of the medicines you take. How can you care for yourself at home? Do not eat too much sugar, fat, or fast foods. You can still have dessert and treats now and then. The goal is moderation. Start small to improve your eating habits. Pay attention to portion sizes, drink less juice and soda pop, and eat more fruits and vegetables. Eat a healthy amount of food. A 3-ounce serving of meat, for example, is about the size of a deck of cards. Fill the rest of your plate with vegetables and whole grains. Limit the amount of soda and sports drinks you have every day. Drink more water when you are thirsty. Eat at least 5 servings of fruits and vegetables every day. It may seem like a lot, but it is not hard to reach this goal. A serving or helping is 1 piece of fruit, 1 cup of vegetables, or 2 cups of leafy, raw vegetables. Have an apple or some carrot sticks as an afternoon snack instead of a candy bar. Try to have fruits and/or  vegetables at every meal. Make exercise part of your daily routine. You may want to start with simple activities, such as walking, bicycling, or slow swimming. Try to be active 30 to 60 minutes every day. You do not need to do all 30 to 60 minutes all at once. For example, you can exercise 3 times a day for 10 or 20 minutes. Moderate exercise is safe for most people, but it is always a good idea to talk to your doctor before starting an exercise program. Keep moving. Mow the lawn, work in the garden, or [employer]. Take the stairs instead of the elevator at work. If you smoke, quit. People who smoke have an increased risk for heart attack, stroke, cancer, and other lung illnesses. Quitting is hard, but there are ways to boost your chance of quitting tobacco for good. Use nicotine gum, patches, or lozenges. Ask your doctor about stop-smoking programs and medicines. Keep trying. In addition to reducing your risk of diseases in the future, you will notice some benefits soon after you stop using tobacco. If you have shortness of breath or asthma symptoms, they will likely get better within a few weeks after you quit. Limit how much alcohol you drink. Moderate amounts of alcohol (up to 2 drinks a day for men, 1 drink a day for women) are  okay. But drinking too much can lead to liver problems, high blood pressure, and other health problems. Family health If you have a family, there are many things you can do together to improve your health. Eat meals together as a family as often as possible. Eat healthy foods. This includes fruits, vegetables, lean meats and dairy, and whole grains. Include your family in your fitness plan. Most people think of activities such as jogging or tennis as the way to fitness, but there are many ways you and your family can be more active. Anything that makes you breathe hard and gets your heart pumping is exercise. Here are some tips: Walk to do errands or to take your  child to school or the bus. Go for a family bike ride after dinner instead of watching TV. Care instructions adapted under license by your healthcare professional. This care instruction is for use with your licensed healthcare professional. If you have questions about a medical condition or this instruction, always ask your healthcare professional. [employer] disclaims any warranty or liability for your use of this information.'

## 2022-02-28 ENCOUNTER — Ambulatory Visit (INDEPENDENT_AMBULATORY_CARE_PROVIDER_SITE_OTHER): Payer: Managed Care, Other (non HMO) | Admitting: Nurse Practitioner

## 2022-02-28 ENCOUNTER — Encounter: Payer: Self-pay | Admitting: Nurse Practitioner

## 2022-02-28 ENCOUNTER — Other Ambulatory Visit: Payer: Self-pay

## 2022-02-28 VITALS — BP 130/74 | HR 96 | Temp 99.0°F | Resp 18 | Ht 66.0 in | Wt 322.0 lb

## 2022-02-28 DIAGNOSIS — R7982 Elevated C-reactive protein (CRP): Secondary | ICD-10-CM | POA: Diagnosis not present

## 2022-02-28 DIAGNOSIS — R768 Other specified abnormal immunological findings in serum: Secondary | ICD-10-CM

## 2022-02-28 DIAGNOSIS — Z0000 Encounter for general adult medical examination without abnormal findings: Secondary | ICD-10-CM

## 2022-02-28 DIAGNOSIS — R700 Elevated erythrocyte sedimentation rate: Secondary | ICD-10-CM

## 2022-02-28 MED ORDER — NOVOFINE PEN NEEDLE 32G X 6 MM MISC: 1.0000 | Freq: Every day | 0 refills | Status: DC

## 2022-02-28 MED ORDER — SAXENDA 18 MG/3ML ~~LOC~~ SOPN: 0.6000 mg | PEN_INJECTOR | Freq: Every day | SUBCUTANEOUS | 0 refills | Status: DC

## 2022-02-28 NOTE — Assessment & Plan Note (Signed)
Sending in prescription for Saxenda, due to [facility] being on backorder.  Discussed side effects with patient.

## 2022-02-28 NOTE — Progress Notes (Signed)
Name: Deanna Graham   MRN: [id number]    DOB: 10-Jun-1996   Date:02/28/2022       Progress Note  Subjective  Chief Complaint  Chief Complaint  Patient presents with   Annual Exam    HPI  Patient presents for annual CPE., discuss obesity medication  Obesity: Patient's weight today is 322 pounds with a BMI of 51.97.  Patient reports her highest weight was 322 lbs. she says she has tried diet and exercise with little success.  Patient would like to try [facility].  Discussed with patient that Mancel Parsons is on backorder.  Patient would like to try Saxenda, discussed side effects and administration.  Patient denies any personal history of pancreatitis or family history of thyroid cancer.  Diet: Well balanced diet, low in dairy.   Exercise: no exercise at this time.  Sleep:7 hours and some times more  [place] Office Visit from 02/28/2022 in [facility]  AUDIT-C Score 0      Depression: Phq 9 is  negative    02/28/2022   11:01 AM 02/19/2022    3:02 PM 01/09/2022    2:45 PM  Depression screen PHQ 2/9  Decreased Interest 0 0   Down, Depressed, Hopeless 0 0 0  PHQ - 2 Score 0 0 0  Altered sleeping $RemoveBeforeDE'1 1 1  'otZkxibrYeqXTmZ$ Tired, decreased energy $RemoveBeforeDE'1 1 1  'LvNbyNHnctVeSLI$ Change in appetite $RemoveBef'1 2 1  'xJMbOyYoXm$ Feeling bad or failure about yourself  0 0 0  Trouble concentrating 0 1 1  Moving slowly or fidgety/restless 0 0 0  Suicidal thoughts 0 0 0  PHQ-9 Score $RemoveBef'3 5 4  'PdEdNSVGum$ Difficult doing work/chores Not difficult at all Somewhat difficult Not difficult at all   Hypertension: BP Readings from Last 3 Encounters:  02/28/22 130/74  02/19/22 (!) 130/92  01/09/22 124/72   Obesity: Wt Readings from Last 3 Encounters:  02/28/22 (!) 322 lb (146.1 kg)  02/19/22 (!) 320 lb 9.6 oz (145.4 kg)  01/09/22 (!) 312 lb 8 oz (141.7 kg)   BMI Readings from Last 3 Encounters:  02/28/22 51.97 kg/m  02/19/22 51.75 kg/m  01/09/22 50.44 kg/m     Vaccines:  HPV: up to at age 89 , ask insurance if age between 80-45  Shingrix:  61-64 yo and ask insurance if covered when patient above 35 yo Pneumonia:  educated and discussed with patient. Flu:  educated and discussed with patient.  Hep C Screening: 01/09/2022 STD testing and prevention (HIV/chl/gon/syphilis): 4/202/2023 Intimate partner violence:none Sexual History :no Menstrual History/LMP/Abnormal Bleeding: irregular periods, [facility] 02/09/2022 seeing GYN for abnormal vaginal bleeding Incontinence Symptoms: none  Breast cancer:  - Last Mammogram: no concerns, does not qualify family history paternal grandmother had breast cancer - BRCA gene screening: none  Osteoporosis: Discussed high calcium and vitamin D supplementation, weight bearing exercises  Cervical cancer screening: 08/25/2019,  will be done at GYN  Skin cancer: Discussed monitoring for atypical lesions  Colorectal cancer: no concerns, does not qualify  Lung cancer:   Low Dose CT Chest recommended if Age 83-80 years, 20 pack-year currently smoking OR have quit w/in 15years. Patient does not qualify.   ECG: none  Advanced Care Planning: A voluntary discussion about advance care planning including the explanation and discussion of advance directives.  Discussed health care proxy and Living will, and the patient was able to identify a health care proxy as mom.  Patient does not have a living will at present time. If patient does have living will, I  have requested they bring this to the clinic to be scanned in to their chart.  Lipids: Lab Results  Component Value Date   CHOL 179 01/09/2022   CHOL 111 11/12/2012   Lab Results  Component Value Date   HDL 37 (L) 01/09/2022   HDL 38 (L) 11/12/2012   Lab Results  Component Value Date   LDLCALC 119 (H) 01/09/2022   LDLCALC 67 11/12/2012   Lab Results  Component Value Date   TRIG 120 01/09/2022   TRIG 31 11/12/2012   Lab Results  Component Value Date   CHOLHDL 4.8 01/09/2022   No results found for: "LDLDIRECT"  Glucose: Glucose  Date Value  Ref Range Status  12/21/2013 146 (H) 65 - 99 mg/dL Final   Glucose, Bld  Date Value Ref Range Status  01/09/2022 110 (H) 65 - 99 mg/dL Final    Comment:    .            Fasting reference interval . For someone without known diabetes, a glucose value between 100 and 125 mg/dL is consistent with prediabetes and should be confirmed with a follow-up test. .     Patient Active Problem List   Diagnosis Date Noted   Depression with anxiety 01/09/2022   Obesity, morbid, BMI 40.0-49.9 ([place]) 01/09/2022    No past surgical history on file.  Family History  Problem Relation Age of Onset   Hypertension Mother    Hypertension Father     Social History   Socioeconomic History   Marital status: Single    Spouse name: Not on file   Number of children: 0   Years of education: Not on file   Highest education level: Not on file  Occupational History   Not on file  Tobacco Use   Smoking status: Never   Smokeless tobacco: Never  Vaping Use   Vaping Use: Every day  Substance and Sexual Activity   Alcohol use: Yes   Drug use: No   Sexual activity: Not Currently    Birth control/protection: None  Other Topics Concern   Not on file  Social History Narrative   Not on file   Social Determinants of Health   Financial Resource Strain: Low Risk  (02/28/2022)   Overall Financial Resource Strain (CARDIA)    Difficulty of Paying Living Expenses: Not hard at all  Food Insecurity: No Food Insecurity (02/28/2022)   Hunger Vital Sign    Worried About Running Out of Food in the Last Year: Never true    E. Lopez in the Last Year: Never true  Transportation Needs: No Transportation Needs (02/28/2022)   PRAPARE - Hydrologist (Medical): No    Lack of Transportation (Non-Medical): No  Physical Activity: Inactive (02/28/2022)   Exercise Vital Sign    Days of Exercise per Week: 0 days    Minutes of Exercise per Session: 0 min  Stress: No Stress Concern Present  (02/28/2022)   [place]    Feeling of Stress : Only a little  Social Connections: Moderately Isolated (02/28/2022)   Social Connection and Isolation Panel [NHANES]    Frequency of Communication with Friends and Family: Twice a week    Frequency of Social Gatherings with Friends and Family: Once a week    Attends Religious Services: Never    Marine scientist or Organizations: No    Attends Archivist Meetings: 1 to  4 times per year    Marital Status: Never married  Intimate Partner Violence: Not At Risk (02/28/2022)   Humiliation, Afraid, Rape, and Kick questionnaire    Fear of Current or Ex-Partner: No    Emotionally Abused: No    Physically Abused: No    Sexually Abused: No     Current Outpatient Medications:    escitalopram (LEXAPRO) 20 MG tablet, Take 20 mg by mouth daily., Disp: , Rfl:    Insulin Pen Needle (NOVOFINE PEN NEEDLE) 32G X 6 MM MISC, 1 each by Does not apply route daily., Disp: 30 each, Rfl: 0   Liraglutide -Weight Management (SAXENDA) 18 MG/3ML SOPN, Inject 0.6 mg into the skin daily., Disp: 3 mL, Rfl: 0   VYVANSE 70 MG capsule, Take 70 mg by mouth every morning., Disp: , Rfl:   Allergies  Allergen Reactions   Amoxicillin Nausea And Vomiting   Diphenhydramine Hcl     Other reaction(s): Vomiting Other reaction(s): GI Intolerance     ROS  Constitutional: Negative for fever or weight change.  Respiratory: Negative for cough and shortness of breath.   Cardiovascular: Negative for chest pain or palpitations.  Gastrointestinal: Negative for abdominal pain, no bowel changes.  Musculoskeletal: Negative for gait problem or joint swelling.  Skin: Negative for rash.  Neurological: Negative for dizziness or headache.  No other specific complaints in a complete review of systems (except as listed in HPI above).   Objective  Vitals:   02/28/22 1056  BP: 130/74  Pulse: 96  Resp: 18   Temp: 99 F (37.2 C)  TempSrc: Oral  SpO2: 98%  Weight: (!) 322 lb (146.1 kg)  Height: $Remove'5\' 6"'SFkAdtb$  (1.676 m)    Body mass index is 51.97 kg/m.  Physical Exam  Constitutional: Patient appears well-developed and well-nourished. No distress.  HENT: Head: Normocephalic and atraumatic. Ears: B TMs ok, no erythema or effusion; Nose: Nose normal. Mouth/Throat: Oropharynx is clear and moist. No oropharyngeal exudate.  Eyes: Conjunctivae and EOM are normal. Pupils are equal, round, and reactive to light. No scleral icterus.  Neck: Normal range of motion. Neck supple. No JVD present. No thyromegaly present.  Cardiovascular: Normal rate, regular rhythm and normal heart sounds.  No murmur heard. No BLE edema. Pulmonary/Chest: Effort normal and breath sounds normal. No respiratory distress. Abdominal: Soft. Bowel sounds are normal, no distension. There is no tenderness. no masses Breast: no lumps or masses, no nipple discharge or rashes Musculoskeletal: Normal range of motion, no joint effusions. No gross deformities Neurological: he is alert and oriented to person, place, and time. No cranial nerve deficit. Coordination, balance, strength, speech and gait are normal.  Skin: Skin is warm and dry. No rash noted. No erythema.  Psychiatric: Patient has a normal mood and affect. behavior is normal. Judgment and thought content normal.   Recent Results (from the past 2160 hour(s))  CBC with Differential/Platelet     Status: Abnormal   Collection Time: 01/09/22  3:33 PM  Result Value Ref Range   WBC 7.8 3.8 - 10.8 Thousand/uL   RBC 4.78 3.80 - 5.10 Million/uL   Hemoglobin 13.0 11.7 - 15.5 g/dL   HCT 39.6 35.0 - 45.0 %   MCV 82.8 80.0 - 100.0 fL   MCH 27.2 27.0 - 33.0 pg   MCHC 32.8 32.0 - 36.0 g/dL   RDW 12.8 11.0 - 15.0 %   Platelets 405 (H) 140 - 400 Thousand/uL   MPV 10.8 7.5 - 12.5 fL   Neutro  Abs 4,727 1,500 - 7,800 cells/uL   Lymphs Abs 2,075 850 - 3,900 cells/uL   Absolute Monocytes 671 200  - 950 cells/uL   Eosinophils Absolute 296 15 - 500 cells/uL   Basophils Absolute 31 0 - 200 cells/uL   Neutrophils Relative % 60.6 %   Total Lymphocyte 26.6 %   Monocytes Relative 8.6 %   Eosinophils Relative 3.8 %   Basophils Relative 0.4 %  COMPLETE METABOLIC PANEL WITH GFR     Status: Abnormal   Collection Time: 01/09/22  3:33 PM  Result Value Ref Range   Glucose, Bld 110 (H) 65 - 99 mg/dL    Comment: .            Fasting reference interval . For someone without known diabetes, a glucose value between 100 and 125 mg/dL is consistent with prediabetes and should be confirmed with a follow-up test. .    BUN 11 7 - 25 mg/dL   Creat 6.64 7.04 - 3.55 mg/dL   eGFR 780 > OR = 60 IY/BWV/5.31X8    Comment: The eGFR is based on the CKD-EPI 2021 equation. To calculate  the new eGFR from a previous Creatinine or Cystatin C result, go to https://www.kidney.org/professionals/ kdoqi/gfr%5Fcalculator    BUN/Creatinine Ratio NOT APPLICABLE 6 - 22 (calc)   Sodium 142 135 - 146 mmol/L   Potassium 4.0 3.5 - 5.3 mmol/L   Chloride 104 98 - 110 mmol/L   CO2 29 20 - 32 mmol/L   Calcium 9.3 8.6 - 10.2 mg/dL   Total Protein 6.6 6.1 - 8.1 g/dL   Albumin 3.9 3.6 - 5.1 g/dL   Globulin 2.7 1.9 - 3.7 g/dL (calc)   AG Ratio 1.4 1.0 - 2.5 (calc)   Total Bilirubin 0.3 0.2 - 1.2 mg/dL   Alkaline phosphatase (APISO) 107 31 - 125 U/L   AST 14 10 - 30 U/L   ALT 27 6 - 29 U/L  Lipid panel     Status: Abnormal   Collection Time: 01/09/22  3:33 PM  Result Value Ref Range   Cholesterol 179 <200 mg/dL   HDL 37 (L) > OR = 50 mg/dL   Triglycerides 131 <655 mg/dL   LDL Cholesterol (Calc) 119 (H) mg/dL (calc)    Comment: Reference range: <100 . Desirable range <100 mg/dL for primary prevention;   <70 mg/dL for patients with CHD or diabetic patients  with > or = 2 CHD risk factors. Marland Kitchen LDL-C is now calculated using the Martin-Hopkins  calculation, which is a validated novel method providing  better  accuracy than the Friedewald equation in the  estimation of LDL-C.  Horald Pollen et al. Lenox Ahr. 3361;459(68): 2061-2068  (http://education.[facility].com/faq/FAQ164)    Total CHOL/HDL Ratio 4.8 <5.0 (calc)   Non-HDL Cholesterol (Calc) 142 (H) <130 mg/dL (calc)    Comment: For patients with diabetes plus 1 major ASCVD risk  factor, treating to a non-HDL-C goal of <100 mg/dL  (LDL-C of <08 mg/dL) is considered a therapeutic  option.   Sedimentation rate     Status: Abnormal   Collection Time: 01/09/22  3:33 PM  Result Value Ref Range   Sed Rate 29 (H) 0 - 20 mm/h  C-reactive protein     Status: Abnormal   Collection Time: 01/09/22  3:33 PM  Result Value Ref Range   CRP 28.8 (H) <8.0 mg/L  ANA     Status: Abnormal   Collection Time: 01/09/22  3:33 PM  Result Value Ref Range  Anti Nuclear Antibody (ANA) POSITIVE (A) NEGATIVE    Comment: ANA IFA is a first line screen for detecting the presence of up to approximately 150 autoantibodies in various autoimmune diseases. A positive ANA IFA result is suggestive of autoimmune disease and reflexes to titer and pattern. Further laboratory testing may be considered if clinically indicated. . For additional information, please refer to http://education.[facility].com/faq/FAQ177 (This link is being provided for informational/ educational purposes only.) .   Hepatitis C antibody     Status: None   Collection Time: 01/09/22  3:33 PM  Result Value Ref Range   Hepatitis C Ab NON-REACTIVE NON-REACTIVE   SIGNAL TO CUT-OFF 0.18 <1.00    Comment: . HCV antibody was non-reactive. There is no laboratory  evidence of HCV infection. . In most cases, no further action is required. However, if recent HCV exposure is suspected, a test for HCV RNA (test code 7175590205) is suggested. . For additional information please refer to http://education.[facility].com/faq/FAQ22v1 (This link is being provided for informational/ educational  purposes only.) .   HIV Antibody (routine testing w rflx)     Status: None   Collection Time: 01/09/22  3:33 PM  Result Value Ref Range   HIV 1&2 Ab, 4th Generation NON-REACTIVE NON-REACTIVE    Comment: HIV-1 antigen and HIV-1/HIV-2 antibodies were not detected. There is no laboratory evidence of HIV infection. Marland Kitchen PLEASE NOTE: This information has been disclosed to you from records whose confidentiality may be protected by state law.  If your state requires such protection, then the state law prohibits you from making any further disclosure of the information without the specific written consent of the person to whom it pertains, or as otherwise permitted by law. A general authorization for the release of medical or other information is NOT sufficient for this purpose. . For additional information please refer to http://education.[facility].com/faq/FAQ106 (This link is being provided for informational/ educational purposes only.) . Marland Kitchen The performance of this assay has not been clinically validated in patients less than 83 years old. .   Hemoglobin A1c     Status: None   Collection Time: 01/09/22  3:33 PM  Result Value Ref Range   Hgb A1c MFr Bld 5.6 <5.7 % of total Hgb    Comment: For the purpose of screening for the presence of diabetes: . <5.7%       Consistent with the absence of diabetes 5.7-6.4%    Consistent with increased risk for diabetes             (prediabetes) > or =6.5%  Consistent with diabetes . This assay result is consistent with a decreased risk of diabetes. . Currently, no consensus exists regarding use of hemoglobin A1c for diagnosis of diabetes in children. . According to American Diabetes Association (ADA) guidelines, hemoglobin A1c <7.0% represents optimal control in non-pregnant diabetic patients. Different metrics may apply to specific patient populations.  Standards of Medical Care in Diabetes(ADA). .    Mean Plasma Glucose 114  mg/dL   eAG (mmol/L) 6.3 mmol/L  TSH     Status: None   Collection Time: 01/09/22  3:33 PM  Result Value Ref Range   TSH 1.25 mIU/L    Comment:           Reference Range .           > or = 20 Years  0.40-4.50 .                Pregnancy Ranges  First trimester    0.26-2.66           Second trimester   0.55-2.73           Third trimester    0.43-2.91   Anti-nuclear ab-titer (ANA titer)     Status: Abnormal   Collection Time: 01/09/22  3:33 PM  Result Value Ref Range   ANA Titer 1 1:80 (H) titer    Comment: A low level ANA titer may be present in pre-clinical autoimmune diseases and normal individuals.                 Reference Range                 <1:40        Negative                 1:40-1:80    Low Antibody Level                 >1:80        Elevated Antibody Level .    ANA Pattern 1 Nuclear, Homogeneous (A)     Comment: Homogeneous pattern is associated with systemic lupus erythematosus (SLE), drug-induced lupus and juvenile idiopathic arthritis. . AC-1: Homogeneous . International Consensus on ANA Patterns (https://www.hernandez-brewer.com/)   POCT urine pregnancy     Status: Normal   Collection Time: 02/19/22  3:28 PM  Result Value Ref Range   Preg Test, Ur Negative Negative     Fall Risk:    02/28/2022   10:58 AM 01/09/2022    2:44 PM  [place] in the past year? 0 0  Number falls in past yr: 0 0  Injury with Fall? 0 0  Follow up Falls evaluation completed Falls evaluation completed    Functional Status Survey: Is the patient deaf or have difficulty hearing?: No Does the patient have difficulty seeing, even when wearing glasses/contacts?: No Does the patient have difficulty concentrating, remembering, or making decisions?: No Does the patient have difficulty walking or climbing stairs?: No Does the patient have difficulty dressing or bathing?: No Does the patient have difficulty doing errands alone such as visiting a doctor's  office or shopping?: No   Assessment & Plan  There are no diagnoses linked to this encounter.  -USPSTF grade A and B recommendations reviewed with patient; age-appropriate recommendations, preventive care, screening tests, etc discussed and encouraged; healthy living encouraged; see AVS for patient education given to patient -Discussed importance of 150 minutes of physical activity weekly, eat two servings of fish weekly, eat one serving of tree nuts ( cashews, pistachios, pecans, almonds.Marland Kitchen) every other day, eat 6 servings of fruit/vegetables daily and drink plenty of water and avoid sweet beverages.

## 2022-03-06 ENCOUNTER — Telehealth: Payer: Self-pay | Admitting: Nurse Practitioner

## 2022-03-06 DIAGNOSIS — F909 Attention-deficit hyperactivity disorder, unspecified type: Secondary | ICD-10-CM

## 2022-03-06 NOTE — Telephone Encounter (Signed)
Copied from CRM [id number]. Topic: Referral - Request for Referral >> Mar 06, 2022  1:34 PM De Blanch wrote: Has patient seen PCP for this complaint? Yes.  Briefly discussed with PCP.  *If NO, is insurance requiring patient see PCP for this issue before PCP can refer them?Yes  Referral for which specialty: Psychiatrist Preferred provider/office: [facility]/Address: [address], [place], [place] [address] Reason for referral: Medication refills. Pt stated PCP advised she needed to see a psychiatrist for refills.

## 2022-03-13 ENCOUNTER — Ambulatory Visit (INDEPENDENT_AMBULATORY_CARE_PROVIDER_SITE_OTHER): Payer: Managed Care, Other (non HMO)

## 2022-03-13 ENCOUNTER — Other Ambulatory Visit: Payer: Self-pay | Admitting: Obstetrics

## 2022-03-13 DIAGNOSIS — N939 Abnormal uterine and vaginal bleeding, unspecified: Secondary | ICD-10-CM

## 2022-03-14 ENCOUNTER — Ambulatory Visit (INDEPENDENT_AMBULATORY_CARE_PROVIDER_SITE_OTHER): Payer: Managed Care, Other (non HMO) | Admitting: Obstetrics

## 2022-03-14 ENCOUNTER — Other Ambulatory Visit: Payer: Managed Care, Other (non HMO)

## 2022-03-14 ENCOUNTER — Encounter: Payer: Self-pay | Admitting: Obstetrics

## 2022-03-14 VITALS — BP 126/84 | Ht 66.0 in | Wt 326.0 lb

## 2022-03-14 DIAGNOSIS — R768 Other specified abnormal immunological findings in serum: Secondary | ICD-10-CM | POA: Diagnosis not present

## 2022-03-14 DIAGNOSIS — Z6841 Body Mass Index (BMI) 40.0 and over, adult: Secondary | ICD-10-CM

## 2022-03-14 DIAGNOSIS — Z803 Family history of malignant neoplasm of breast: Secondary | ICD-10-CM | POA: Diagnosis not present

## 2022-03-14 DIAGNOSIS — R7689 Other specified abnormal immunological findings in serum: Secondary | ICD-10-CM

## 2022-03-14 DIAGNOSIS — N939 Abnormal uterine and vaginal bleeding, unspecified: Secondary | ICD-10-CM | POA: Diagnosis not present

## 2022-03-14 DIAGNOSIS — Z30011 Encounter for initial prescription of contraceptive pills: Secondary | ICD-10-CM

## 2022-03-14 MED ORDER — NORETHIN ACE-ETH ESTRAD-FE 1-20 MG-MCG PO TABS: 1.0000 | ORAL_TABLET | Freq: Every day | ORAL | 0 refills | Status: DC

## 2022-03-19 LAB — SEX HORMONE BINDING GLOBULIN: Sex Hormone Binding: 29.5 nmol/L (ref 24.6–122.0)

## 2022-03-19 LAB — 17-HYDROXYPROGESTERONE: 17-OH Progesterone LCMS: 53 ng/dL

## 2022-03-19 LAB — DHEA-SULFATE: DHEA-SO4: 109 ug/dL (ref 84.8–378.0)

## 2022-03-19 LAB — ANTI MULLERIAN HORMONE: ANTI-MULLERIAN HORMONE (AMH): 6.52 ng/mL

## 2022-03-19 LAB — FOLLICLE STIMULATING HORMONE: FSH: 5.2 m[IU]/mL

## 2022-03-19 LAB — TESTOSTERONE: Testosterone: 51 ng/dL (ref 13–71)

## 2022-03-19 LAB — ESTRADIOL: Estradiol: 77.2 pg/mL

## 2022-03-19 LAB — PROLACTIN: Prolactin: 17.3 ng/mL (ref 4.8–23.3)

## 2022-03-19 LAB — INSULIN, RANDOM: INSULIN: 35.6 u[IU]/mL — ABNORMAL HIGH (ref 2.6–24.9)

## 2022-03-19 LAB — LUTEINIZING HORMONE: LH: 11.9 m[IU]/mL

## 2022-03-21 ENCOUNTER — Telehealth: Payer: Self-pay

## 2022-03-21 NOTE — Telephone Encounter (Signed)
Patient called triage line, requesting a call back from out office to schedule her annual physical. Patient states that she was last seen by Tresea Mall but was unsure when she was suppose to return back. Please reach out to patient and schedule. Patient can be reached at [phone].Thanks. KW

## 2022-03-24 NOTE — Telephone Encounter (Signed)
Called and left voicemail for patient to call back to be scheduled. 

## 2022-04-01 NOTE — Telephone Encounter (Signed)
Called and left voicemail for patient to call back to be scheduled. Patient will be due per [place] for pap smear in December.

## 2022-06-02 ENCOUNTER — Telehealth: Payer: Self-pay | Admitting: Nurse Practitioner

## 2022-06-02 NOTE — Telephone Encounter (Signed)
Pt called due to Liraglutide -Weight Management (SAXENDA) 18 MG/3ML SOPN not being covered by her insurance/ pt asked if she can get a PA for this or if she can get [facility] prescribed with a PA/ please advise

## 2022-06-03 NOTE — Telephone Encounter (Signed)
I started a PA for this patient. DId you prescribe. I do not see on chart

## 2022-06-04 NOTE — Telephone Encounter (Signed)
PA approved. Sated Saxenda covered. Will send approval notice to patient

## 2022-06-06 NOTE — Telephone Encounter (Signed)
Patient called in about pa for saxenda. I gave her the info of letter being sent for approval. Has this been sent. Please call back for further assistance

## 2022-06-06 NOTE — Telephone Encounter (Signed)
Patient notified of approval. 

## 2022-11-06 ENCOUNTER — Ambulatory Visit: Payer: Managed Care, Other (non HMO) | Admitting: Podiatry

## 2023-04-01 NOTE — Progress Notes (Unsigned)
Name: Deanna Graham   MRN: [id number]    DOB: 1995/11/06   Date:04/02/2023       Progress Note  Subjective  Chief Complaint  Chief Complaint  Patient presents with   Annual Exam    HPI  Patient presents for annual CPE and chronic issue  ADHD: she says she has been on vyvanse for adhd for a long time. She says she has been off of it for about two years. She says that she is starting to have issues with not being on it and would like to start back. She was previously seeing psychiatry in [place] for this and would like to see a psychiatrist closer to home.  Referral placed.   Diet: she says she eats out of convenience,  try and work on eating more well balanced Exercise: No exercising, recommend start walking  Last Eye Exam: July 19,2024 Last Dental Exam: June 4,2024  Flowsheet Row Office Visit from 04/02/2023 in [facility]  AUDIT-C Score 1      Depression: Phq 9 is  negative    04/02/2023    3:13 PM 02/28/2022   11:01 AM 02/19/2022    3:02 PM 01/09/2022    2:45 PM  Depression screen PHQ 2/9  Decreased Interest 0 0 0   Down, Depressed, Hopeless 0 0 0 0  PHQ - 2 Score 0 0 0 0  Altered sleeping  1 1 1   Tired, decreased energy  1 1 1   Change in appetite  1 2 1   Feeling bad or failure about yourself   0 0 0  Trouble concentrating  0 1 1  Moving slowly or fidgety/restless  0 0 0  Suicidal thoughts  0 0 0  PHQ-9 Score  3 5 4   Difficult doing work/chores  Not difficult at all Somewhat difficult Not difficult at all   Hypertension: BP Readings from Last 3 Encounters:  04/02/23 122/74  03/14/22 126/84  02/28/22 130/74   Obesity: Wt Readings from Last 3 Encounters:  04/02/23 (!) 334 lb 1.6 oz (151.5 kg)  03/14/22 (!) 326 lb (147.9 kg)  02/28/22 (!) 322 lb (146.1 kg)   BMI Readings from Last 3 Encounters:  04/02/23 53.93 kg/m  03/14/22 52.62 kg/m  02/28/22 51.97 kg/m     Vaccines:  HPV: up to at age 21 , ask insurance if age between  43-45  Shingrix: 11-64 yo and ask insurance if covered when patient above 77 yo Pneumonia:  educated and discussed with patient. Flu:  educated and discussed with patient.     Hep C Screening: 01/09/2022 STD testing and prevention (HIV/chl/gon/syphilis): 01/09/2022 Intimate partner violence: negative screen  Sexual History : yes, not currently on birth control,  same sex partner Menstrual History/LMP/Abnormal Bleeding: [facility]: irregular, she saw GYN,  she says that they stated she had PCOS offered OCP and she declined, she would like to speak to someone else.  Discussed importance of follow up if any post-menopausal bleeding: n/a Incontinence Symptoms: negative for symptoms   Breast cancer:  - Last Mammogram: not due, family history of breast cancer - BRCA gene screening: none  Osteoporosis Prevention : Discussed high calcium and vitamin D supplementation, weight bearing exercises Bone density :not applicable   Cervical cancer screening: due, does not want to have it done today  Skin cancer: Discussed monitoring for atypical lesions  Colorectal cancer: not due, no concerns   Lung cancer:  Low Dose CT Chest recommended if Age 23-80 years, 20 pack-year  currently smoking OR have quit w/in 15years. Patient does not qualify for screen   ECG: none  Advanced Care Planning: A voluntary discussion about advance care planning including the explanation and discussion of advance directives.  Discussed health care proxy and Living will, and the patient was able to identify a health care proxy as Mom.  Patient does not have a living will and power of attorney of health care   Lipids: Lab Results  Component Value Date   CHOL 179 01/09/2022   CHOL 111 11/12/2012   Lab Results  Component Value Date   HDL 37 (L) 01/09/2022   HDL 38 (L) 11/12/2012   Lab Results  Component Value Date   LDLCALC 119 (H) 01/09/2022   LDLCALC 67 11/12/2012   Lab Results  Component Value Date   TRIG 120 01/09/2022    TRIG 31 11/12/2012   Lab Results  Component Value Date   CHOLHDL 4.8 01/09/2022   No results found for: "LDLDIRECT"  Glucose: Glucose  Date Value Ref Range Status  12/21/2013 146 (H) 65 - 99 mg/dL Final   Glucose, Bld  Date Value Ref Range Status  01/09/2022 110 (H) 65 - 99 mg/dL Final    Comment:    .            Fasting reference interval . For someone without known diabetes, a glucose value between 100 and 125 mg/dL is consistent with prediabetes and should be confirmed with a follow-up test. .     Patient Active Problem List   Diagnosis Date Noted   Depression with anxiety 01/09/2022   Obesity, morbid, BMI 40.0-49.9 (HCC) 01/09/2022    No past surgical history on file.  Family History  Problem Relation Age of Onset   Hypertension Mother    Hypertension Father     Social History   Socioeconomic History   Marital status: Single    Spouse name: Not on file   Number of children: 0   Years of education: Not on file   Highest education level: Bachelor's degree (e.g., BA, AB, BS)  Occupational History   Not on file  Tobacco Use   Smoking status: Never   Smokeless tobacco: Never  Vaping Use   Vaping status: Former   Quit date: 03/05/2022  Substance and Sexual Activity   Alcohol use: Yes   Drug use: No   Sexual activity: Not Currently    Birth control/protection: None  Other Topics Concern   Not on file  Social History Narrative   Not on file   Social Determinants of Health   Financial Resource Strain: Low Risk  (04/01/2023)   Overall Financial Resource Strain (CARDIA)    Difficulty of Paying Living Expenses: Not very hard  Food Insecurity: No Food Insecurity (04/02/2023)   Hunger Vital Sign    Worried About Running Out of Food in the Last Year: Never true    Ran Out of Food in the Last Year: Never true  Transportation Needs: No Transportation Needs (04/02/2023)   PRAPARE - Administrator, Civil Service (Medical): No    Lack of  Transportation (Non-Medical): No  Physical Activity: Insufficiently Active (04/02/2023)   Exercise Vital Sign    Days of Exercise per Week: 1 day    Minutes of Exercise per Session: 10 min  Stress: Stress Concern Present (04/01/2023)   [employer] of Occupational Health - Occupational Stress Questionnaire    Feeling of Stress : To some extent  Social Connections: Moderately  Isolated (04/02/2023)   Social Connection and Isolation Panel [NHANES]    Frequency of Communication with Friends and Family: More than three times a week    Frequency of Social Gatherings with Friends and Family: More than three times a week    Attends Religious Services: 1 to 4 times per year    Active Member of [employer] or Organizations: No    Attends Banker Meetings: Never    Marital Status: Never married  Intimate Partner Violence: Not At Risk (04/02/2023)   Humiliation, Afraid, Rape, and Kick questionnaire    Fear of Current or Ex-Partner: No    Emotionally Abused: No    Physically Abused: No    Sexually Abused: No    No current outpatient medications on file.  Allergies  Allergen Reactions   Amoxicillin Nausea And Vomiting   Diphenhydramine Hcl     Other reaction(s): Vomiting Other reaction(s): GI Intolerance     ROS  Constitutional: Negative for fever or weight change.  Respiratory: Negative for cough and shortness of breath.   Cardiovascular: Negative for chest pain or palpitations.  Gastrointestinal: Negative for abdominal pain, no bowel changes.  Musculoskeletal: Negative for gait problem or joint swelling.  Skin: Negative for rash.  Neurological: Negative for dizziness or headache.  No other specific complaints in a complete review of systems (except as listed in HPI above).   Objective  Vitals:   04/02/23 1513  BP: 122/74  Pulse: 98  Resp: 16  Temp: 98.5 F (36.9 C)  TempSrc: Oral  SpO2: 96%  Weight: (!) 334 lb 1.6 oz (151.5 kg)  Height: 5\' 6"  (1.676 m)    Body  mass index is 53.93 kg/m.  Physical Exam Constitutional: Patient appears well-developed and well-nourished. No distress.  HENT: Head: Normocephalic and atraumatic. Ears: B TMs ok, no erythema or effusion; Nose: Nose normal. Mouth/Throat: Oropharynx is clear and moist. No oropharyngeal exudate.  Eyes: Conjunctivae and EOM are normal. Pupils are equal, round, and reactive to light. No scleral icterus.  Neck: Normal range of motion. Neck supple. No JVD present. No thyromegaly present.  Cardiovascular: Normal rate, regular rhythm and normal heart sounds.  No murmur heard. No BLE edema. Pulmonary/Chest: Effort normal and breath sounds normal. No respiratory distress. Abdominal: Soft. Bowel sounds are normal, no distension. There is no tenderness. no masses Breast: no lumps or masses, no nipple discharge or rashes Musculoskeletal: Normal range of motion, no joint effusions. No gross deformities Neurological: he is alert and oriented to person, place, and time. No cranial nerve deficit. Coordination, balance, strength, speech and gait are normal.  Skin: Skin is warm and dry. No rash noted. No erythema.  Psychiatric: Patient has a normal mood and affect. behavior is normal. Judgment and thought content normal.   No results found for this or any previous visit (from the past 2160 hour(s)).   Fall Risk:    04/02/2023    3:13 PM 02/28/2022   10:58 AM 01/09/2022    2:44 PM  Fall Risk   Falls in the past year? 1 0 0  Number falls in past yr: 0 0 0  Injury with Fall? 0 0 0  Follow up  Falls evaluation completed Falls evaluation completed     Functional Status Survey: Is the patient deaf or have difficulty hearing?: No Does the patient have difficulty seeing, even when wearing glasses/contacts?: No Does the patient have difficulty concentrating, remembering, or making decisions?: No Does the patient have difficulty walking or climbing  stairs?: No Does the patient have difficulty dressing or  bathing?: No Does the patient have difficulty doing errands alone such as visiting a doctor's office or shopping?: No   Assessment & Plan  1. Physical exam, annual  - CBC with Differential/Platelet - COMPLETE METABOLIC PANEL WITH GFR - Lipid panel - Hemoglobin A1c  2. Screening for diabetes mellitus  - COMPLETE METABOLIC PANEL WITH GFR - Hemoglobin A1c  3. Screening for deficiency anemia  - CBC with Differential/Platelet  4. Screening for cholesterol level  - Lipid panel  5. Attention deficit hyperactivity disorder (ADHD), unspecified ADHD type  - Ambulatory referral to Psychiatry  6. Irregular periods  - Ambulatory referral to Obstetrics / Gynecology   -USPSTF grade A and B recommendations reviewed with patient; age-appropriate recommendations, preventive care, screening tests, etc discussed and encouraged; healthy living encouraged; see AVS for patient education given to patient -Discussed importance of 150 minutes of physical activity weekly, eat two servings of fish weekly, eat one serving of tree nuts ( cashews, pistachios, pecans, almonds.Marland Kitchen) every other day, eat 6 servings of fruit/vegetables daily and drink plenty of water and avoid sweet beverages.   -Reviewed Health Maintenance: Yes.

## 2023-04-02 ENCOUNTER — Encounter: Payer: Self-pay | Admitting: Nurse Practitioner

## 2023-04-02 ENCOUNTER — Ambulatory Visit (INDEPENDENT_AMBULATORY_CARE_PROVIDER_SITE_OTHER): Payer: Managed Care, Other (non HMO) | Admitting: Nurse Practitioner

## 2023-04-02 ENCOUNTER — Other Ambulatory Visit: Payer: Self-pay

## 2023-04-02 VITALS — BP 122/74 | HR 98 | Temp 98.5°F | Resp 16 | Ht 66.0 in | Wt 334.1 lb

## 2023-04-02 DIAGNOSIS — N926 Irregular menstruation, unspecified: Secondary | ICD-10-CM

## 2023-04-02 DIAGNOSIS — Z130 Encounter for screening for diseases of the blood and blood-forming organs and certain disorders involving the immune mechanism: Secondary | ICD-10-CM | POA: Diagnosis not present

## 2023-04-02 DIAGNOSIS — Z13220 Encounter for screening for lipoid disorders: Secondary | ICD-10-CM

## 2023-04-02 DIAGNOSIS — Z131 Encounter for screening for diabetes mellitus: Secondary | ICD-10-CM

## 2023-04-02 DIAGNOSIS — Z0000 Encounter for general adult medical examination without abnormal findings: Secondary | ICD-10-CM

## 2023-04-02 DIAGNOSIS — F909 Attention-deficit hyperactivity disorder, unspecified type: Secondary | ICD-10-CM

## 2023-04-03 LAB — COMPLETE METABOLIC PANEL WITH GFR
AG Ratio: 1.5 (calc) (ref 1.0–2.5)
ALT: 24 U/L (ref 6–29)
AST: 14 U/L (ref 10–30)
Albumin: 4 g/dL (ref 3.6–5.1)
Alkaline phosphatase (APISO): 98 U/L (ref 31–125)
BUN: 11 mg/dL (ref 7–25)
CO2: 28 mmol/L (ref 20–32)
Calcium: 9.4 mg/dL (ref 8.6–10.2)
Chloride: 103 mmol/L (ref 98–110)
Creat: 0.76 mg/dL (ref 0.50–0.96)
Globulin: 2.7 g/dL (calc) (ref 1.9–3.7)
Glucose, Bld: 68 mg/dL (ref 65–99)
Potassium: 4.4 mmol/L (ref 3.5–5.3)
Sodium: 140 mmol/L (ref 135–146)
Total Bilirubin: 0.3 mg/dL (ref 0.2–1.2)
Total Protein: 6.7 g/dL (ref 6.1–8.1)
eGFR: 110 mL/min/{1.73_m2} (ref >=60)

## 2023-04-03 LAB — CBC WITH DIFFERENTIAL/PLATELET
Absolute Monocytes: 901 cells/uL (ref 200–950)
Basophils Absolute: 36 cells/uL (ref 0–200)
Basophils Relative: 0.4 %
Eosinophils Absolute: 382 cells/uL (ref 15–500)
Eosinophils Relative: 4.2 %
HCT: 38.7 % (ref 35.0–45.0)
Hemoglobin: 12.6 g/dL (ref 11.7–15.5)
Lymphs Abs: 2439 cells/uL (ref 850–3900)
MCH: 26.3 pg — ABNORMAL LOW (ref 27.0–33.0)
MCHC: 32.6 g/dL (ref 32.0–36.0)
MCV: 80.6 fL (ref 80.0–100.0)
MPV: 10.5 fL (ref 7.5–12.5)
Monocytes Relative: 9.9 %
Neutro Abs: 5342 cells/uL (ref 1500–7800)
Neutrophils Relative %: 58.7 %
Platelets: 393 10*3/uL (ref 140–400)
RBC: 4.8 10*6/uL (ref 3.80–5.10)
RDW: 13.2 % (ref 11.0–15.0)
Total Lymphocyte: 26.8 %
WBC: 9.1 10*3/uL (ref 3.8–10.8)

## 2023-04-03 LAB — HEMOGLOBIN A1C
Hgb A1c MFr Bld: 6 % of total Hgb — ABNORMAL HIGH (ref <5.7)
Mean Plasma Glucose: 126 mg/dL
eAG (mmol/L): 7 mmol/L

## 2023-04-03 LAB — LIPID PANEL
Cholesterol: 196 mg/dL (ref <200)
HDL: 42 mg/dL — ABNORMAL LOW (ref >=50)
LDL Cholesterol (Calc): 132 mg/dL (calc) — ABNORMAL HIGH
Non-HDL Cholesterol (Calc): 154 mg/dL (calc) — ABNORMAL HIGH (ref <130)
Total CHOL/HDL Ratio: 4.7 (calc) (ref <5.0)
Triglycerides: 111 mg/dL (ref <150)

## 2023-06-23 LAB — HM PAP SMEAR

## 2023-07-01 ENCOUNTER — Encounter: Payer: Self-pay | Admitting: Nurse Practitioner

## 2023-09-21 ENCOUNTER — Encounter: Payer: Self-pay | Admitting: Nurse Practitioner

## 2024-04-04 ENCOUNTER — Encounter: Payer: BC Managed Care – PPO | Admitting: Nurse Practitioner

## 2024-08-15 ENCOUNTER — Ambulatory Visit (INDEPENDENT_AMBULATORY_CARE_PROVIDER_SITE_OTHER): Payer: Self-pay | Admitting: Nurse Practitioner

## 2024-08-15 ENCOUNTER — Encounter: Payer: Self-pay | Admitting: Nurse Practitioner

## 2024-08-15 VITALS — BP 130/88 | HR 109 | Temp 98.6°F | Resp 18 | Ht 66.0 in | Wt 340.2 lb

## 2024-08-15 DIAGNOSIS — E785 Hyperlipidemia, unspecified: Secondary | ICD-10-CM

## 2024-08-15 DIAGNOSIS — F909 Attention-deficit hyperactivity disorder, unspecified type: Secondary | ICD-10-CM

## 2024-08-15 DIAGNOSIS — I10 Essential (primary) hypertension: Secondary | ICD-10-CM

## 2024-08-15 DIAGNOSIS — R7303 Prediabetes: Secondary | ICD-10-CM

## 2024-08-15 MED ORDER — HYDROCHLOROTHIAZIDE 12.5 MG PO TABS: 12.5000 mg | ORAL_TABLET | Freq: Every day | ORAL | 0 refills | Status: DC

## 2024-08-15 NOTE — Progress Notes (Signed)
 BP 130/88   Pulse (!) 109   Temp 98.6 F (37 C)   Resp 18   Ht 5' 6 (1.676 m)   Wt (!) 340 lb 3.2 oz (154.3 kg)   LMP 07/25/2024   SpO2 96%   BMI 54.91 kg/m    Subjective:    Patient ID: Deanna Graham, female    DOB: 1996-04-26, 28 y.o.   MRN: [id number]  HPI: Deanna Graham is a 28 y.o. female  Chief Complaint  Patient presents with   Hypertension    Getting high bp readings w/ headaches   Discussed the use of AI scribe software for clinical note transcription with the patient, who gave verbal consent to proceed.  History of Present Illness Deanna Graham is a 28 year old female who presents with elevated blood pressure readings.  Elevated blood pressure - Current blood pressure measured at 138/94 mmHg in clinic - Recent home blood pressure reading of 137/92 mmHg - Family history of hypertension in both parents, both on antihypertensive medications - No prior association of headaches with elevated blood pressure  Headache - Headaches occur approximately every two weeks - Headaches relieved by ibuprofen - No initial association of headaches with elevated blood pressure  Peripheral edema - Ankle swelling, particularly after long shifts at work - Swelling attributed to prolonged sitting - Family history of similar swelling in grandmother and mother  Obesity and sedentary lifestyle - Weight recorded at 340 pounds - BMI of 54.9 - Sedentary job contributing to weight issues - Recent transition to day shift, anticipating increased opportunity for physical activity  Prediabetes and hyperlipidemia - No laboratory work in over a year - Previous A1c of 6.0, consistent with prediabetes - Previous LDL of 132 mg/dL, consistent with hyperlipidemia  Attention deficit hyperactivity disorder (adhd) - Currently taking Vyvanse for ADHD  Dietary habits - Conscious of sodium intake, influenced by grandmother's dietary habits    BP Readings from Last 3 Encounters:   08/15/24 130/88  04/02/23 122/74  03/14/22 126/84        08/15/2024    9:18 AM 04/02/2023    3:13 PM 02/28/2022   11:01 AM  Depression screen PHQ 2/9  Decreased Interest 0 0 0  Down, Depressed, Hopeless 0 0 0  PHQ - 2 Score 0 0 0  Altered sleeping 0  1  Tired, decreased energy 0  1  Change in appetite 0  1  Feeling bad or failure about yourself  0  0  Trouble concentrating 0  0  Moving slowly or fidgety/restless 0  0  Suicidal thoughts 0  0  PHQ-9 Score 0  3   Difficult doing work/chores Not difficult at all  Not difficult at all     Data saved with a previous flowsheet row definition    Relevant past medical, surgical, family and social history reviewed and updated as indicated. Interim medical history since our last visit reviewed. Allergies and medications reviewed and updated.  Review of Systems  Constitutional: Negative for fever or weight change.  Respiratory: Negative for cough and shortness of breath.   Cardiovascular: Negative for chest pain or palpitations.  Gastrointestinal: Negative for abdominal pain, no bowel changes.  Musculoskeletal: Negative for gait problem or joint swelling.  Skin: Negative for rash.  Neurological: Negative for dizziness or headache.  No other specific complaints in a complete review of systems (except as listed in HPI above).      Objective:      BP 130/88  Pulse (!) 109   Temp 98.6 F (37 C)   Resp 18   Ht 5' 6 (1.676 m)   Wt (!) 340 lb 3.2 oz (154.3 kg)   LMP 07/25/2024   SpO2 96%   BMI 54.91 kg/m    Wt Readings from Last 3 Encounters:  08/15/24 (!) 340 lb 3.2 oz (154.3 kg)  04/02/23 (!) 334 lb 1.6 oz (151.5 kg)  03/14/22 (!) 326 lb (147.9 kg)    Physical Exam VITALS: BP- 138/94 MEASUREMENTS: Weight- 340, BMI- 54.9. GENERAL: Alert, cooperative, well developed, no acute distress HEENT: Normocephalic, normal oropharynx, moist mucous membranes CHEST: Clear to auscultation bilaterally, No wheezes, rhonchi, or  crackles CARDIOVASCULAR: Normal heart rate and rhythm, S1 and S2 normal without murmurs ABDOMEN: Soft, non-tender, non-distended, without organomegaly, Normal bowel sounds EXTREMITIES: No cyanosis or edema NEUROLOGICAL: Cranial nerves grossly intact, Moves all extremities without gross motor or sensory deficit  Results for orders placed or performed in visit on 04/02/23  CBC with Differential/Platelet   Collection Time: 04/02/23  3:49 PM  Result Value Ref Range   WBC 9.1 3.8 - 10.8 Thousand/uL   RBC 4.80 3.80 - 5.10 Million/uL   Hemoglobin 12.6 11.7 - 15.5 g/dL   HCT 61.2 64.9 - 54.9 %   MCV 80.6 80.0 - 100.0 fL   MCH 26.3 (L) 27.0 - 33.0 pg   MCHC 32.6 32.0 - 36.0 g/dL   RDW 86.7 88.9 - 84.9 %   Platelets 393 140 - 400 Thousand/uL   MPV 10.5 7.5 - 12.5 fL   Neutro Abs 5,342 1,500 - 7,800 cells/uL   Lymphs Abs 2,439 850 - 3,900 cells/uL   Absolute Monocytes 901 200 - 950 cells/uL   Eosinophils Absolute 382 15 - 500 cells/uL   Basophils Absolute 36 0 - 200 cells/uL   Neutrophils Relative % 58.7 %   Total Lymphocyte 26.8 %   Monocytes Relative 9.9 %   Eosinophils Relative 4.2 %   Basophils Relative 0.4 %  COMPLETE METABOLIC PANEL WITH GFR   Collection Time: 04/02/23  3:49 PM  Result Value Ref Range   Glucose, Bld 68 65 - 99 mg/dL   BUN 11 7 - 25 mg/dL   Creat 9.23 9.49 - 9.03 mg/dL   eGFR 889 > OR = 60 fO/fpw/8.26f7   BUN/Creatinine Ratio SEE NOTE: 6 - 22 (calc)   Sodium 140 135 - 146 mmol/L   Potassium 4.4 3.5 - 5.3 mmol/L   Chloride 103 98 - 110 mmol/L   CO2 28 20 - 32 mmol/L   Calcium 9.4 8.6 - 10.2 mg/dL   Total Protein 6.7 6.1 - 8.1 g/dL   Albumin 4.0 3.6 - 5.1 g/dL   Globulin 2.7 1.9 - 3.7 g/dL (calc)   AG Ratio 1.5 1.0 - 2.5 (calc)   Total Bilirubin 0.3 0.2 - 1.2 mg/dL   Alkaline phosphatase (APISO) 98 31 - 125 U/L   AST 14 10 - 30 U/L   ALT 24 6 - 29 U/L  Lipid panel   Collection Time: 04/02/23  3:49 PM  Result Value Ref Range   Cholesterol 196 <200 mg/dL    HDL 42 (L) > OR = 50 mg/dL   Triglycerides 888 <849 mg/dL   LDL Cholesterol (Calc) 132 (H) mg/dL (calc)   Total CHOL/HDL Ratio 4.7 <5.0 (calc)   Non-HDL Cholesterol (Calc) 154 (H) <130 mg/dL (calc)  Hemoglobin J8r   Collection Time: 04/02/23  3:49 PM  Result Value Ref Range   Hgb  A1c MFr Bld 6.0 (H) <5.7 % of total Hgb   Mean Plasma Glucose 126 mg/dL   eAG (mmol/L) 7.0 mmol/L          Assessment & Plan:   Problem List Items Addressed This Visit       Cardiovascular and Mediastinum   Primary hypertension - Primary   Relevant Medications   hydrochlorothiazide  (HYDRODIURIL ) 12.5 MG tablet   Other Relevant Orders   CBC with Differential/Platelet   Comprehensive metabolic panel with GFR     Other   Obesity, morbid, BMI 40.0-49.9 (HCC)   Relevant Medications   lisdexamfetamine (VYVANSE) 30 MG capsule   Other Relevant Orders   TSH   Hyperlipidemia   Relevant Medications   hydrochlorothiazide  (HYDRODIURIL ) 12.5 MG tablet   Other Relevant Orders   Lipid panel   Other Visit Diagnoses       Attention deficit hyperactivity disorder (ADHD), unspecified ADHD type         Prediabetes       Relevant Orders   Hemoglobin A1c        Assessment and Plan Assessment & Plan Essential hypertension Blood pressure is elevated at 138/94 mmHg, previously 122/74 mmHg last year. Possible contribution from Vyvanse use. Reports headaches and ankle swelling, possibly related to hypertension. Family history of hypertension. Discussed risks of uncontrolled hypertension, including heart failure and kidney damage. Explained importance of controlling blood pressure to prevent complications. Discussed medication options, including hydrochlorothiazide  and amlodipine, with preference for hydrochlorothiazide  due to leg swelling risk with amlodipine. Metoprolol not recommended due to potential interaction with Vyvanse. ACE inhibitors and ARBs discussed but not first-line options. - Prescribed  hydrochlorothiazide  for blood pressure management. - Ordered lab work to assess kidney function and metabolic panel. - Advised home blood pressure monitoring. - Scheduled follow-up appointment in 4 weeks.  Hyperlipidemia Previously elevated lipid panel with LDL at 132 mg/dL. No recent lab work to assess current status. - Ordered lipid panel as part of lab work.  Morbid obesity Weight is 340 pounds with a BMI of 54.9. Discussed impact of obesity on overall health and importance of weight management. Encouraged lifestyle modifications including exercise and dietary changes. - Encouraged regular exercise, including weight-bearing activities. - Advised on dietary modifications, particularly reducing sodium intake.  Prediabetes Previously in prediabetic range with HbA1c of 6.0%. No recent lab work to assess current status. - Ordered metabolic panel to assess glucose levels.  General Health Maintenance Discussed importance of exercise for cardiovascular health, mental health, and osteoporosis prevention. Encouraged weight-bearing exercises. - Encouraged regular exercise, including weight-bearing activities.        Follow up plan: Return in about 4 weeks (around 09/12/2024) for follow up.

## 2024-08-16 ENCOUNTER — Ambulatory Visit: Payer: Self-pay | Admitting: Nurse Practitioner

## 2024-08-16 DIAGNOSIS — D75839 Thrombocytosis, unspecified: Secondary | ICD-10-CM

## 2024-08-16 LAB — CBC WITH DIFFERENTIAL/PLATELET
Absolute Lymphocytes: 2419 {cells}/uL (ref 850–3900)
Absolute Monocytes: 710 {cells}/uL (ref 200–950)
Basophils Absolute: 29 {cells}/uL (ref 0–200)
Basophils Relative: 0.3 %
Eosinophils Absolute: 451 {cells}/uL (ref 15–500)
Eosinophils Relative: 4.7 %
HCT: 37.5 % (ref 35.9–46.0)
Hemoglobin: 11.9 g/dL (ref 11.7–15.5)
MCH: 25.1 pg — ABNORMAL LOW (ref 27.0–33.0)
MCHC: 31.7 g/dL (ref 31.6–35.4)
MCV: 79.1 fL — ABNORMAL LOW (ref 81.4–101.7)
MPV: 10.8 fL (ref 7.5–12.5)
Monocytes Relative: 7.4 %
Neutro Abs: 5990 {cells}/uL (ref 1500–7800)
Neutrophils Relative %: 62.4 %
Platelets: 528 Thousand/uL — ABNORMAL HIGH (ref 140–400)
RBC: 4.74 Million/uL (ref 3.80–5.10)
RDW: 13.2 % (ref 11.0–15.0)
Total Lymphocyte: 25.2 %
WBC: 9.6 Thousand/uL (ref 3.8–10.8)

## 2024-08-16 LAB — COMPREHENSIVE METABOLIC PANEL WITH GFR
AG Ratio: 1.3 (calc) (ref 1.0–2.5)
ALT: 29 U/L (ref 6–29)
AST: 17 U/L (ref 10–30)
Albumin: 4 g/dL (ref 3.6–5.1)
Alkaline phosphatase (APISO): 100 U/L (ref 31–125)
BUN: 13 mg/dL (ref 7–25)
CO2: 26 mmol/L (ref 20–32)
Calcium: 9.7 mg/dL (ref 8.6–10.2)
Chloride: 102 mmol/L (ref 98–110)
Creat: 0.82 mg/dL (ref 0.50–0.96)
Globulin: 3.2 g/dL (ref 1.9–3.7)
Glucose, Bld: 109 mg/dL — ABNORMAL HIGH (ref 65–99)
Potassium: 4.6 mmol/L (ref 3.5–5.3)
Sodium: 137 mmol/L (ref 135–146)
Total Bilirubin: 0.3 mg/dL (ref 0.2–1.2)
Total Protein: 7.2 g/dL (ref 6.1–8.1)
eGFR: 100 mL/min/1.73m2 (ref >=60)

## 2024-08-16 LAB — LIPID PANEL
Cholesterol: 180 mg/dL (ref <200)
HDL: 42 mg/dL — ABNORMAL LOW (ref >=50)
LDL Cholesterol (Calc): 122 mg/dL — ABNORMAL HIGH
Non-HDL Cholesterol (Calc): 138 mg/dL — ABNORMAL HIGH (ref <130)
Total CHOL/HDL Ratio: 4.3 (calc) (ref <5.0)
Triglycerides: 64 mg/dL (ref <150)

## 2024-08-16 LAB — HEMOGLOBIN A1C
Hgb A1c MFr Bld: 6.1 % — ABNORMAL HIGH (ref <5.7)
Mean Plasma Glucose: 128 mg/dL
eAG (mmol/L): 7.1 mmol/L

## 2024-08-16 LAB — TSH: TSH: 1.53 m[IU]/L

## 2024-09-12 ENCOUNTER — Ambulatory Visit (INDEPENDENT_AMBULATORY_CARE_PROVIDER_SITE_OTHER): Payer: Self-pay | Admitting: Nurse Practitioner

## 2024-09-12 ENCOUNTER — Encounter: Payer: Self-pay | Admitting: Nurse Practitioner

## 2024-09-12 DIAGNOSIS — I10 Essential (primary) hypertension: Secondary | ICD-10-CM

## 2024-09-12 LAB — CBC WITH DIFFERENTIAL/PLATELET
Absolute Lymphocytes: 2204 {cells}/uL (ref 850–3900)
Absolute Monocytes: 763 {cells}/uL (ref 200–950)
Basophils Absolute: 28 {cells}/uL (ref 0–200)
Basophils Relative: 0.3 %
Eosinophils Absolute: 344 {cells}/uL (ref 15–500)
Eosinophils Relative: 3.7 %
HCT: 35 % — ABNORMAL LOW (ref 35.9–46.0)
Hemoglobin: 11 g/dL — ABNORMAL LOW (ref 11.7–15.5)
MCH: 23.8 pg — ABNORMAL LOW (ref 27.0–33.0)
MCHC: 31.4 g/dL — ABNORMAL LOW (ref 31.6–35.4)
MCV: 75.6 fL — ABNORMAL LOW (ref 81.4–101.7)
MPV: 10.9 fL (ref 7.5–12.5)
Monocytes Relative: 8.2 %
Neutro Abs: 5961 {cells}/uL (ref 1500–7800)
Neutrophils Relative %: 64.1 %
Platelets: 499 Thousand/uL — ABNORMAL HIGH (ref 140–400)
RBC: 4.63 Million/uL (ref 3.80–5.10)
RDW: 13.2 % (ref 11.0–15.0)
Total Lymphocyte: 23.7 %
WBC: 9.3 Thousand/uL (ref 3.8–10.8)

## 2024-09-12 MED ORDER — HYDROCHLOROTHIAZIDE 12.5 MG PO TABS: 12.5000 mg | ORAL_TABLET | Freq: Every day | ORAL | 1 refills | Status: AC

## 2024-09-12 NOTE — Progress Notes (Signed)
 "  BP (!) 132/90   Pulse 99   Temp 98.3 F (36.8 C)   Resp 18   Ht 5' 6 (1.676 m)   Wt (!) 338 lb (153.3 kg)   LMP 07/25/2024   SpO2 97%   BMI 54.55 kg/m    Subjective:    Patient ID: Deanna Graham, female    DOB: [birth year], 28 y.o.   MRN: [id number]  HPI: Deanna Graham is a 28 y.o. female  Chief Complaint  Patient presents with   Medical Management of Chronic Issues   Discussed the use of AI scribe software for clinical note transcription with the patient, who gave verbal consent to proceed.  History of Present Illness Deanna Graham is a 28 year old female with hypertension who presents for a four-week follow-up.  Blood pressure management - Hypertension managed with hydrochlorothiazide  and clonidine - Blood pressure at last visit: 138/94 mmHg; recheck: 130/88 mmHg - Current blood pressure in office: 132/90 mmHg - Home blood pressure readings consistently under 128/80 mmHg, often around 121/80 mmHg - Anxiety during office visits may contribute to elevated in-office readings Improved symptoms of headache and leg swelling  Antihypertensive medication tolerance and side effects - Currently taking hydrochlorothiazide  in the morning - Clonidine at bedtime for ADHD - No significant side effects from hydrochlorothiazide , including no increased urination - Improvement in previous symptoms of headaches and ankle swelling  Lifestyle and functional status - Increased motivation to walk and higher daily step count - Works in dispatch with a rotating shift schedule, including holidays - 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  Laboratory findings - Platelet count elevated at 528 on last laboratory evaluation  Associated symptoms - No chest pain - No shortness of breath         08/15/2024    9:18 AM 04/02/2023    3:13 PM 02/28/2022   11:01 AM  Depression screen PHQ 2/9  Decreased Interest 0 0 0  Down, Depressed, Hopeless 0 0 0  PHQ - 2  Score 0 0 0  Altered sleeping 0  1  Tired, decreased energy 0  1  Change in appetite 0  1  Feeling bad or failure about yourself  0  0  Trouble concentrating 0  0  Moving slowly or fidgety/restless 0  0  Suicidal thoughts 0  0  PHQ-9 Score 0  3   Difficult doing work/chores Not difficult at all  Not difficult at all     Data saved with a previous flowsheet row definition    Relevant past medical, surgical, family and social history reviewed and updated as indicated. Interim medical history since our last visit reviewed. Allergies and medications reviewed and updated.  Review of Systems  Constitutional: Negative for fever or weight change.  Respiratory: Negative for cough and shortness of breath.   Cardiovascular: Negative for chest pain or palpitations.  Gastrointestinal: Negative for abdominal pain, no bowel changes.  Musculoskeletal: Negative for gait problem or joint swelling.  Skin: Negative for rash.  Neurological: Negative for dizziness or headache.  No other specific complaints in a complete review of systems (except as listed in HPI above).      Objective:      BP (!) 132/90   Pulse 99   Temp 98.3 F (36.8 C)   Resp 18   Ht 5' 6 (1.676 m)   Wt (!) 338 lb (153.3 kg)   LMP 07/25/2024   SpO2 97%   BMI 54.55 kg/m  Wt Readings from Last 3 Encounters:  09/12/24 (!) 338 lb (153.3 kg)  08/15/24 (!) 340 lb 3.2 oz (154.3 kg)  04/02/23 (!) 334 lb 1.6 oz (151.5 kg)    Physical Exam VITALS: BP- 132/90 GENERAL: Alert, cooperative, well developed, no acute distress HEENT: Normocephalic, normal oropharynx, moist mucous membranes CHEST: Clear to auscultation bilaterally, No wheezes, rhonchi, or crackles CARDIOVASCULAR: Normal heart rate and rhythm, S1 and S2 normal without murmurs ABDOMEN: Soft, non-tender, non-distended, without organomegaly, Normal bowel sounds EXTREMITIES: No cyanosis or edema NEUROLOGICAL: Cranial nerves grossly intact, Moves all extremities  without gross motor or sensory deficit  Results for orders placed or performed in visit on 08/16/24  HM PAP SMEAR   Collection Time: 06/23/23 10:26 AM  Result Value Ref Range   HM Pap smear NILM           Assessment & Plan:   Problem List Items Addressed This Visit       Cardiovascular and Mediastinum   Primary hypertension   Relevant Medications   cloNIDine (CATAPRES) 0.1 MG tablet   hydrochlorothiazide  (HYDRODIURIL ) 12.5 MG tablet     Assessment and Plan Assessment & Plan Primary hypertension Blood pressure readings have been variable, with office readings of 138/94, 130/88, and 132/90, while home readings are consistently under 128/80. Symptoms such as headaches and swelling have improved, suggesting effective control outside the office. Clonidine, prescribed for ADHD, may contribute to blood pressure control. - Continue hydrochlorothiazide  in the morning. - Monitor blood pressure at home regularly. - Rechecked blood pressure in the office. - Scheduled follow-up in six months, or sooner if home readings increase.  Thrombocytosis Previous platelet count was 528, above the threshold of concern. Possible dehydration at the time of the initial test may have contributed to the elevated count. - Rechecked platelet count today. - If platelet count remains elevated, will refer to hematology for further evaluation.        Follow up plan: Return in about 6 months (around 03/13/2025) for follow up. "

## 2024-09-13 ENCOUNTER — Other Ambulatory Visit: Payer: Self-pay | Admitting: Nurse Practitioner

## 2024-09-13 DIAGNOSIS — D75839 Thrombocytosis, unspecified: Secondary | ICD-10-CM

## 2024-09-26 ENCOUNTER — Inpatient Hospital Stay

## 2024-09-26 ENCOUNTER — Encounter: Payer: Self-pay | Admitting: Oncology

## 2024-09-26 ENCOUNTER — Inpatient Hospital Stay: Payer: Self-pay | Attending: Oncology | Admitting: Oncology

## 2024-09-26 VITALS — BP 133/87 | HR 88 | Temp 99.5°F | Resp 18 | Ht 66.0 in | Wt 337.0 lb

## 2024-09-26 DIAGNOSIS — D509 Iron deficiency anemia, unspecified: Secondary | ICD-10-CM | POA: Diagnosis not present

## 2024-09-26 DIAGNOSIS — D75839 Thrombocytosis, unspecified: Secondary | ICD-10-CM

## 2024-09-26 DIAGNOSIS — Z87891 Personal history of nicotine dependence: Secondary | ICD-10-CM | POA: Diagnosis not present

## 2024-09-26 LAB — FOLATE: Folate: 7 ng/mL

## 2024-09-26 LAB — CBC (CANCER CENTER ONLY)
HCT: 35.1 % — ABNORMAL LOW (ref 36.0–46.0)
Hemoglobin: 11.1 g/dL — ABNORMAL LOW (ref 12.0–15.0)
MCH: 23.7 pg — ABNORMAL LOW (ref 26.0–34.0)
MCHC: 31.6 g/dL (ref 30.0–36.0)
MCV: 75 fL — ABNORMAL LOW (ref 80.0–100.0)
Platelet Count: 465 K/uL — ABNORMAL HIGH (ref 150–400)
RBC: 4.68 MIL/uL (ref 3.87–5.11)
RDW: 13.8 % (ref 11.5–15.5)
WBC Count: 11.2 K/uL — ABNORMAL HIGH (ref 4.0–10.5)
nRBC: 0 % (ref 0.0–0.2)

## 2024-09-26 LAB — VITAMIN B12: Vitamin B-12: 456 pg/mL (ref 180–914)

## 2024-09-26 LAB — IRON AND TIBC
Iron: 22 ug/dL — ABNORMAL LOW (ref 28–170)
Saturation Ratios: 5 % — ABNORMAL LOW (ref 10.4–31.8)
TIBC: 455 ug/dL — ABNORMAL HIGH (ref 250–450)
UIBC: 434 ug/dL

## 2024-09-26 LAB — FERRITIN: Ferritin: 30 ng/mL (ref 11–307)

## 2024-09-27 ENCOUNTER — Encounter: Payer: Self-pay | Admitting: Oncology

## 2024-09-27 DIAGNOSIS — D509 Iron deficiency anemia, unspecified: Secondary | ICD-10-CM

## 2024-09-27 NOTE — Progress Notes (Signed)
 " [facility]  [phone] [phone]  ID: Deanna Graham OB: 08-03-1996  MR#: [id number]  RDW#:[id number]  Patient Care Team: Deanna Mliss FALCON, FNP as PCP - General (Nurse Practitioner)  CHIEF COMPLAINT: Iron deficiency anemia, thrombocytosis.  INTERVAL HISTORY: Patient is a 29 year old female who was noted to have an increased platelet count on routine blood work and is referred for further evaluation.  She currently feels well and is asymptomatic.  She has no neurologic complaints.  She denies any recent fevers or illnesses.  She has a good appetite and denies weight loss.  She has no chest pain, shortness of breath, cough, or hemoptysis.  She denies any nausea, vomiting, constipation, or diarrhea.  She has no melena or hematochezia.  She has no urinary complaints.  Patient offers no further specific complaints.  REVIEW OF SYSTEMS:   Review of Systems  Constitutional: Negative.  Negative for fever, malaise/fatigue and weight loss.  Respiratory: Negative.  Negative for cough, hemoptysis and shortness of breath.   Cardiovascular: Negative.  Negative for chest pain and leg swelling.  Gastrointestinal: Negative.  Negative for abdominal pain.  Genitourinary: Negative.  Negative for dysuria.  Musculoskeletal: Negative.  Negative for back pain.  Skin: Negative.  Negative for rash.  Neurological: Negative.  Negative for dizziness, focal weakness, weakness and headaches.  Psychiatric/Behavioral: Negative.  The patient is not nervous/anxious.     As per HPI. Otherwise, a complete review of systems is negative.  PAST MEDICAL HISTORY: Past Medical History:  Diagnosis Date   ADHD    Anxiety    Depression     PAST SURGICAL HISTORY: History reviewed. No pertinent surgical history.  FAMILY HISTORY: Family History  Problem Relation Age of Onset   Hypertension Mother    Hypertension Father     ADVANCED DIRECTIVES (Y/N):  N  HEALTH  MAINTENANCE: Social History[1]   Colonoscopy:  PAP:  Bone density:  Lipid panel:  Allergies[2]  Current Outpatient Medications  Medication Sig Dispense Refill   cloNIDine (CATAPRES) 0.1 MG tablet Take 0.1 mg by mouth at bedtime.     hydrochlorothiazide  (HYDRODIURIL ) 12.5 MG tablet Take 1 tablet (12.5 mg total) by mouth daily. 90 tablet 1   lisdexamfetamine (VYVANSE) 40 MG capsule Take 40 mg by mouth every morning.     SLYND 4 MG TABS Take 1 tablet by mouth daily.     No current facility-administered medications for this visit.    OBJECTIVE: Vitals:   09/26/24 1541  BP: 133/87  Pulse: 88  Resp: 18  Temp: 99.5 F (37.5 C)  SpO2: 100%     Body mass index is 54.39 kg/m.    ECOG FS:0 - Asymptomatic  General: Well-developed, well-nourished, no acute distress. Eyes: Pink conjunctiva, anicteric sclera. HEENT: Normocephalic, moist mucous membranes. Lungs: No audible wheezing or coughing. Heart: Regular rate and rhythm. Abdomen: Soft, nontender, no obvious distention. Musculoskeletal: No edema, cyanosis, or clubbing. Neuro: Alert, answering all questions appropriately. Cranial nerves grossly intact. Skin: No rashes or petechiae noted. Psych: Normal affect. Lymphatics: No cervical, calvicular, axillary or inguinal LAD.   LAB RESULTS:  Lab Results  Component Value Date   NA 137 08/15/2024   K 4.6 08/15/2024   CL 102 08/15/2024   CO2 26 08/15/2024   GLUCOSE 109 (H) 08/15/2024   BUN 13 08/15/2024   CREATININE 0.82 08/15/2024   CALCIUM 9.7 08/15/2024   PROT 7.2 08/15/2024   ALBUMIN 3.0 (L) 12/21/2013   AST 17 08/15/2024   ALT 29  08/15/2024   ALKPHOS 117 12/21/2013   BILITOT 0.3 08/15/2024   GFRNONAA >60 12/21/2013   GFRAA >60 12/21/2013    Lab Results  Component Value Date   WBC 11.2 (H) 09/26/2024   NEUTROABS 5,961 09/12/2024   HGB 11.1 (L) 09/26/2024   HCT 35.1 (L) 09/26/2024   MCV 75.0 (L) 09/26/2024   PLT 465 (H) 09/26/2024   Lab Results  Component  Value Date   IRON 22 (L) 09/26/2024   TIBC 455 (H) 09/26/2024   IRONPCTSAT 5 (L) 09/26/2024   Lab Results  Component Value Date   FERRITIN 30 09/26/2024     STUDIES: No results found.  ASSESSMENT: Iron deficiency anemia, thrombocytosis.  PLAN:    Iron deficiency anemia: Patient's hemoglobin and iron stores are trending down.  Return to clinic 5 times over the next 1 to 2 weeks to receive 200 mg IV Venofer.  Patient to return to clinic in 4 months with repeat laboratory, further evaluation, and continuation of treatment if needed. Thrombocytosis: Likely secondary to iron deficiency.  Have ordered JAK2 mutation with reflex for completeness.  I spent a total of 45 minutes reviewing chart data, face-to-face evaluation with the patient, counseling and coordination of care as detailed above.   Patient expressed understanding and was in agreement with this plan. She also understands that She can call clinic at any time with any questions, concerns, or complaints.    Deanna JINNY Reusing, MD   09/27/2024 7:50 AM        [1]  Social History Tobacco Use   Smoking status: Never   Smokeless tobacco: Never  Vaping Use   Vaping status: Former   Quit date: 03/05/2022  Substance Use Topics   Alcohol use: Yes   Drug use: No  [2]  Allergies Allergen Reactions   Amoxicillin Nausea And Vomiting   Diphenhydramine Hcl     Other reaction(s): Vomiting Other reaction(s): GI Intolerance   "

## 2024-09-30 ENCOUNTER — Inpatient Hospital Stay

## 2024-09-30 VITALS — BP 117/72 | HR 92 | Temp 99.3°F | Resp 16

## 2024-09-30 DIAGNOSIS — D509 Iron deficiency anemia, unspecified: Secondary | ICD-10-CM

## 2024-09-30 MED ORDER — IRON SUCROSE 20 MG/ML IV SOLN
200.0000 mg | Freq: Once | INTRAVENOUS | Status: AC
  Administered 2024-09-30: 200 mg via INTRAVENOUS
  Filled 2024-09-30: qty 10

## 2024-09-30 NOTE — Patient Instructions (Signed)

## 2024-10-04 ENCOUNTER — Inpatient Hospital Stay

## 2024-10-04 VITALS — BP 116/65 | HR 98 | Temp 100.0°F | Resp 14

## 2024-10-04 DIAGNOSIS — D509 Iron deficiency anemia, unspecified: Secondary | ICD-10-CM

## 2024-10-04 MED ORDER — IRON SUCROSE 20 MG/ML IV SOLN
200.0000 mg | Freq: Once | INTRAVENOUS | Status: AC
  Administered 2024-10-04: 200 mg via INTRAVENOUS
  Filled 2024-10-04: qty 10

## 2024-10-04 NOTE — Patient Instructions (Signed)

## 2024-10-05 LAB — JAK2 V617F RFX CALR/MPL/E12-15

## 2024-10-05 LAB — CALR +MPL + E12-E15  (REFLEX)

## 2024-10-06 ENCOUNTER — Inpatient Hospital Stay

## 2024-10-06 VITALS — BP 134/62 | HR 67 | Temp 99.1°F | Resp 18

## 2024-10-06 DIAGNOSIS — D509 Iron deficiency anemia, unspecified: Secondary | ICD-10-CM | POA: Diagnosis not present

## 2024-10-06 MED ORDER — IRON SUCROSE 20 MG/ML IV SOLN
200.0000 mg | Freq: Once | INTRAVENOUS | Status: AC
  Administered 2024-10-06: 200 mg via INTRAVENOUS
  Filled 2024-10-06: qty 10

## 2024-10-10 ENCOUNTER — Inpatient Hospital Stay

## 2024-10-10 VITALS — BP 130/79 | HR 95 | Temp 97.9°F | Resp 16

## 2024-10-10 DIAGNOSIS — D509 Iron deficiency anemia, unspecified: Secondary | ICD-10-CM | POA: Diagnosis not present

## 2024-10-10 MED ORDER — IRON SUCROSE 20 MG/ML IV SOLN
200.0000 mg | Freq: Once | INTRAVENOUS | Status: AC
  Administered 2024-10-10: 200 mg via INTRAVENOUS
  Filled 2024-10-10: qty 10

## 2024-10-10 NOTE — Progress Notes (Signed)
Pt denies any chance of pregnancy.

## 2024-10-10 NOTE — Patient Instructions (Signed)

## 2024-10-14 ENCOUNTER — Inpatient Hospital Stay

## 2024-10-14 VITALS — BP 113/78 | HR 80 | Temp 98.7°F | Resp 16

## 2024-10-14 DIAGNOSIS — D509 Iron deficiency anemia, unspecified: Secondary | ICD-10-CM

## 2024-10-14 MED ORDER — IRON SUCROSE 20 MG/ML IV SOLN
200.0000 mg | Freq: Once | INTRAVENOUS | Status: AC
  Administered 2024-10-14: 200 mg via INTRAVENOUS
  Filled 2024-10-14: qty 10

## 2025-01-25 ENCOUNTER — Inpatient Hospital Stay

## 2025-01-26 ENCOUNTER — Inpatient Hospital Stay: Admitting: Oncology

## 2025-01-26 ENCOUNTER — Inpatient Hospital Stay

## 2025-03-13 ENCOUNTER — Ambulatory Visit: Payer: Self-pay | Admitting: Nurse Practitioner
